# Patient Record
Sex: Female | Born: 1980 | Race: Black or African American | Hispanic: No | Marital: Single | Smoking: Current every day smoker
Health system: Southern US, Community
[De-identification: ages and names within clinical notes are randomized; demographics above are authoritative.]

## PROBLEM LIST (undated history)

## (undated) DIAGNOSIS — I10 Essential (primary) hypertension: Secondary | ICD-10-CM

## (undated) DIAGNOSIS — E079 Disorder of thyroid, unspecified: Secondary | ICD-10-CM

## (undated) HISTORY — PX: TUBAL LIGATION: SHX77

---

## 2010-01-24 ENCOUNTER — Emergency Department (HOSPITAL_BASED_OUTPATIENT_CLINIC_OR_DEPARTMENT_OTHER)
Admission: EM | Admit: 2010-01-24 | Discharge: 2010-01-24 | Payer: Self-pay | Source: Home / Self Care | Admitting: Emergency Medicine

## 2010-01-30 LAB — URINALYSIS, ROUTINE W REFLEX MICROSCOPIC
Bilirubin Urine: NEGATIVE
Hgb urine dipstick: NEGATIVE
Ketones, ur: NEGATIVE mg/dL
Nitrite: NEGATIVE
Protein, ur: NEGATIVE mg/dL
Specific Gravity, Urine: 1.021 (ref 1.005–1.030)
Urine Glucose, Fasting: NEGATIVE mg/dL
Urobilinogen, UA: 1 mg/dL (ref 0.0–1.0)
pH: 6.5 (ref 5.0–8.0)

## 2010-01-30 LAB — URINE MICROSCOPIC-ADD ON

## 2010-01-30 LAB — PREGNANCY, URINE: Preg Test, Ur: NEGATIVE

## 2010-03-10 ENCOUNTER — Emergency Department (INDEPENDENT_AMBULATORY_CARE_PROVIDER_SITE_OTHER): Payer: Medicaid Other

## 2010-03-10 ENCOUNTER — Emergency Department (HOSPITAL_BASED_OUTPATIENT_CLINIC_OR_DEPARTMENT_OTHER)
Admission: EM | Admit: 2010-03-10 | Discharge: 2010-03-10 | Disposition: A | Discharge status: Home / Self Care | Payer: Medicaid Other | Attending: Emergency Medicine | Admitting: Emergency Medicine

## 2010-03-10 DIAGNOSIS — N949 Unspecified condition associated with female genital organs and menstrual cycle: Secondary | ICD-10-CM | POA: Insufficient documentation

## 2010-03-10 DIAGNOSIS — N83209 Unspecified ovarian cyst, unspecified side: Secondary | ICD-10-CM | POA: Insufficient documentation

## 2010-03-10 DIAGNOSIS — N898 Other specified noninflammatory disorders of vagina: Secondary | ICD-10-CM | POA: Insufficient documentation

## 2010-03-10 DIAGNOSIS — R109 Unspecified abdominal pain: Secondary | ICD-10-CM | POA: Insufficient documentation

## 2010-03-10 DIAGNOSIS — I10 Essential (primary) hypertension: Secondary | ICD-10-CM | POA: Insufficient documentation

## 2010-03-10 DIAGNOSIS — N9489 Other specified conditions associated with female genital organs and menstrual cycle: Secondary | ICD-10-CM

## 2010-03-10 LAB — URINALYSIS, ROUTINE W REFLEX MICROSCOPIC
Bilirubin Urine: NEGATIVE
Hgb urine dipstick: NEGATIVE
Ketones, ur: NEGATIVE mg/dL
Nitrite: NEGATIVE
Protein, ur: NEGATIVE mg/dL
Specific Gravity, Urine: 1.021 (ref 1.005–1.030)
Urine Glucose, Fasting: NEGATIVE mg/dL
Urobilinogen, UA: 0.2 mg/dL (ref 0.0–1.0)
pH: 7.5 (ref 5.0–8.0)

## 2010-03-10 LAB — WET PREP, GENITAL
Clue Cells Wet Prep HPF POC: NONE SEEN
Trich, Wet Prep: NONE SEEN
Yeast Wet Prep HPF POC: NONE SEEN

## 2010-03-10 LAB — PREGNANCY, URINE: Preg Test, Ur: NEGATIVE

## 2010-03-13 LAB — GC/CHLAMYDIA PROBE AMP, GENITAL
Chlamydia, DNA Probe: NEGATIVE
GC Probe Amp, Genital: NEGATIVE

## 2011-03-21 ENCOUNTER — Emergency Department (HOSPITAL_BASED_OUTPATIENT_CLINIC_OR_DEPARTMENT_OTHER)
Admission: EM | Admit: 2011-03-21 | Discharge: 2011-03-21 | Disposition: A | Discharge status: Home Health | Payer: Medicaid Other | Attending: Emergency Medicine | Admitting: Emergency Medicine

## 2011-03-21 ENCOUNTER — Encounter (HOSPITAL_BASED_OUTPATIENT_CLINIC_OR_DEPARTMENT_OTHER): Payer: Self-pay | Admitting: Family Medicine

## 2011-03-21 DIAGNOSIS — I10 Essential (primary) hypertension: Secondary | ICD-10-CM | POA: Insufficient documentation

## 2011-03-21 DIAGNOSIS — G43909 Migraine, unspecified, not intractable, without status migrainosus: Secondary | ICD-10-CM | POA: Insufficient documentation

## 2011-03-21 DIAGNOSIS — R42 Dizziness and giddiness: Secondary | ICD-10-CM | POA: Insufficient documentation

## 2011-03-21 DIAGNOSIS — H538 Other visual disturbances: Secondary | ICD-10-CM | POA: Insufficient documentation

## 2011-03-21 HISTORY — DX: Essential (primary) hypertension: I10

## 2011-03-21 HISTORY — DX: Disorder of thyroid, unspecified: E07.9

## 2011-03-21 MED ORDER — DEXAMETHASONE SODIUM PHOSPHATE 10 MG/ML IJ SOLN
10.0000 mg | Freq: Once | INTRAMUSCULAR | Status: AC
  Administered 2011-03-21: 10 mg via INTRAVENOUS
  Filled 2011-03-21: qty 1

## 2011-03-21 MED ORDER — METOCLOPRAMIDE HCL 5 MG/ML IJ SOLN
10.0000 mg | Freq: Once | INTRAMUSCULAR | Status: AC
  Administered 2011-03-21: 10 mg via INTRAVENOUS
  Filled 2011-03-21: qty 2

## 2011-03-21 MED ORDER — KETOROLAC TROMETHAMINE 30 MG/ML IJ SOLN
30.0000 mg | Freq: Once | INTRAMUSCULAR | Status: AC
  Administered 2011-03-21: 30 mg via INTRAVENOUS
  Filled 2011-03-21: qty 1

## 2011-03-21 MED ORDER — MECLIZINE HCL 25 MG PO TABS
25.0000 mg | ORAL_TABLET | Freq: Once | ORAL | Status: AC
  Administered 2011-03-21: 25 mg via ORAL
  Filled 2011-03-21: qty 1

## 2011-03-21 NOTE — ED Provider Notes (Signed)
History     CSN: [id number]  Arrival date & time 03/21/11  1107   First MD Initiated Contact with Patient 03/21/11 1138      Chief Complaint  Patient presents with  . Headache  . Blurred Vision    (Consider location/radiation/quality/duration/timing/severity/associated sxs/prior treatment) HPI Comments: She also states that she has had dizziness today. It is worse with standing and moving her head. Date she feels like she is on miracle round.  Patient is a 31 y.o. female presenting with headaches. The history is provided by the patient.  Headache  This is a recurrent problem. The current episode started less than 1 hour ago. The problem occurs constantly. The problem has been gradually worsening. The headache is associated with loud noise and activity. The pain is located in the frontal region. The quality of the pain is described as throbbing. The pain is at a severity of 8/10. The pain is moderate. The pain does not radiate. Pertinent negatives include no nausea and no vomiting. She has tried nothing for the symptoms. The treatment provided no relief.    Past Medical History  Diagnosis Date  . Thyroid disease   . Hypertension   . Migraine     History reviewed. No pertinent past surgical history.  No family history on file.  History  Substance Use Topics  . Smoking status: Current Everyday Smoker    Types: Cigarettes  . Smokeless tobacco: Not on file  . Alcohol Use: No    OB History    Grav Para Term Preterm Abortions TAB SAB Ect Mult Living                  Review of Systems  Gastrointestinal: Negative for nausea and vomiting.  Neurological: Positive for headaches.  All other systems reviewed and are negative.    Allergies  Benadryl  Home Medications   Current Outpatient Rx  Name Route Sig Dispense Refill  . TYLENOL PO Oral Take by mouth.      BP 138/81  Pulse 80  Temp(Src) 97.6 F (36.4 C) (Oral)  Resp 20  Ht 4\' 10"  (1.473 m)  Wt 115 lb (52.164  kg)  BMI 24.04 kg/m2  SpO2 100%  LMP 03/01/2011  Physical Exam  Nursing note and vitals reviewed. Constitutional: She is oriented to person, place, and time. She appears well-developed and well-nourished. No distress.  HENT:  Head: Normocephalic and atraumatic.  Right Ear: External ear normal.  Left Ear: External ear normal.  Mouth/Throat: Oropharynx is clear and moist.  Eyes: EOM are normal. Pupils are equal, round, and reactive to light.       No nystagmus  Cardiovascular: Normal rate, regular rhythm, normal heart sounds and intact distal pulses.  Exam reveals no friction rub.   No murmur heard. Pulmonary/Chest: Effort normal and breath sounds normal. She has no wheezes. She has no rales.  Abdominal: Soft. Bowel sounds are normal. She exhibits no distension. There is no tenderness. There is no rebound and no guarding.  Musculoskeletal: Normal range of motion. She exhibits no tenderness.       No edema  Neurological: She is alert and oriented to person, place, and time. She has normal strength. No cranial nerve deficit or sensory deficit. Coordination normal.       No photophobia  Skin: Skin is warm and dry. No rash noted.  Psychiatric: She has a normal mood and affect. Her behavior is normal.    ED Course  Procedures (including critical care  time)  Labs Reviewed - No data to display No results found.   No diagnosis found.    MDM   Pt with typical migraine HA without sx suggestive of SAH(sudden onset, worst of life, or deficits), infection, or cavernous vein thrombosis.  Normal neuro exam and vital signs.  Also has symptoms suggestive of vertigo. States her spinning is worse when she stands. She has a history of migraines and states usually she will get pain behind her eyes and then the headache starts which is what happened today. She denies any infections or recent sinus complaints. Will give HA cocktail and  re-eval.  1:08 PM Patient feeling much better and her  headache is resolved.        Gwyneth Sprout, MD 03/21/11 1308

## 2011-03-21 NOTE — Discharge Instructions (Signed)
Migraine Headache  A migraine is very bad pain on one or both sides of your head. The cause of a migraine is not always known. A migraine can be triggered or caused by different things, such as:   Alcohol.   Smoking.   Stress.   Periods (menstruation) in women.   Aged cheeses.   Foods or drinks that contain nitrates, glutamate, aspartame, or tyramine.   Lack of sleep.   Chocolate.   Caffeine.   Hunger.   Medicines, such as nitroglycerine (used to treat chest pain), birth control pills, estrogen, and some blood pressure medicines.  HOME CARE   Many medicines can help migraine pain or keep migraines from coming back. Your doctor can help you decide on a medicine or treatment program.   If you or your child gets a migraine, it may help to lie down in a dark, quiet room.   Keep a headache journal. This may help find out what is causing the headaches. For example, write down:   What you eat and drink.   How much sleep you get.   Any change to your diet or medicines.  GET HELP RIGHT AWAY IF:    The medicine does not work.   The pain begins again.   The neck is stiff.   You have trouble seeing.   The muscles are weak or you lose muscle control.   You have new symptoms.   You lose your balance.   You have trouble walking.   You feel faint or pass out.  MAKE SURE YOU:    Understand these instructions.   Will watch this condition.   Will get help right away if you are not doing well or get worse.  Document Released: 10/11/2007 Document Revised: 12/21/2010 Document Reviewed: 09/06/2008  [employer] Patient Information 2012 [employer].

## 2011-03-21 NOTE — ED Notes (Signed)
Pt c/o "pain around my eyes and blurry vision" x 1 hour. Pt c/o left side of head "numb" and left hand "numb". Pt reports h/o "migraines with medication until 2 years ago".

## 2011-05-27 ENCOUNTER — Emergency Department (HOSPITAL_BASED_OUTPATIENT_CLINIC_OR_DEPARTMENT_OTHER)
Admission: EM | Admit: 2011-05-27 | Discharge: 2011-05-27 | Disposition: A | Discharge status: Home / Self Care | Payer: Medicaid Other | Attending: Emergency Medicine | Admitting: Emergency Medicine

## 2011-05-27 ENCOUNTER — Encounter (HOSPITAL_BASED_OUTPATIENT_CLINIC_OR_DEPARTMENT_OTHER): Payer: Self-pay | Admitting: *Deleted

## 2011-05-27 DIAGNOSIS — I10 Essential (primary) hypertension: Secondary | ICD-10-CM | POA: Insufficient documentation

## 2011-05-27 DIAGNOSIS — F17200 Nicotine dependence, unspecified, uncomplicated: Secondary | ICD-10-CM | POA: Insufficient documentation

## 2011-05-27 DIAGNOSIS — N946 Dysmenorrhea, unspecified: Secondary | ICD-10-CM | POA: Insufficient documentation

## 2011-05-27 DIAGNOSIS — E079 Disorder of thyroid, unspecified: Secondary | ICD-10-CM | POA: Insufficient documentation

## 2011-05-27 DIAGNOSIS — N83209 Unspecified ovarian cyst, unspecified side: Secondary | ICD-10-CM | POA: Insufficient documentation

## 2011-05-27 LAB — WET PREP, GENITAL
Clue Cells Wet Prep HPF POC: NONE SEEN
Trich, Wet Prep: NONE SEEN
Yeast Wet Prep HPF POC: NONE SEEN

## 2011-05-27 LAB — URINALYSIS, ROUTINE W REFLEX MICROSCOPIC
Bilirubin Urine: NEGATIVE
Glucose, UA: NEGATIVE mg/dL
Ketones, ur: NEGATIVE mg/dL
Leukocytes, UA: NEGATIVE
Nitrite: NEGATIVE
Protein, ur: 30 mg/dL — AB
Specific Gravity, Urine: 1.025 (ref 1.005–1.030)
Urobilinogen, UA: 1 mg/dL (ref 0.0–1.0)
pH: 6 (ref 5.0–8.0)

## 2011-05-27 LAB — URINE MICROSCOPIC-ADD ON

## 2011-05-27 LAB — PREGNANCY, URINE: Preg Test, Ur: NEGATIVE

## 2011-05-27 MED ORDER — OXYCODONE-ACETAMINOPHEN 5-325 MG PO TABS
1.0000 | ORAL_TABLET | Freq: Once | ORAL | Status: AC
  Administered 2011-05-27: 1 via ORAL
  Filled 2011-05-27: qty 1

## 2011-05-27 MED ORDER — OXYCODONE-ACETAMINOPHEN 5-325 MG PO TABS: 1.0000 | ORAL_TABLET | Freq: Four times a day (QID) | ORAL | Status: AC | PRN

## 2011-05-27 NOTE — ED Notes (Signed)
Pt states she has a hx of irregular periods and this one started day before yesterday. Started out "light", which was unusual per pt, but now having heavy bleeding and clots, LLQ pain. Used 5 regular pads today.

## 2011-05-27 NOTE — Discharge Instructions (Signed)
Dysmenorrhea Menstrual pain is caused by the muscles of the uterus tightening (contracting) during a menstrual period. The muscles of the uterus contract due to the chemicals in the uterine lining. Primary dysmenorrhea is menstrual cramps that last a couple of days when you start having menstrual periods or soon after. This often begins after a teenager starts having her period. As a woman gets older or has a baby, the cramps will usually lesson or disappear. Secondary dysmenorrhea begins later in life, lasts longer, and the pain may be stronger than primary dysmenorrhea. The pain may start before the period and last a few days after the period. This type of dysmenorrhea is usually caused by an underlying problem such as:  The tissue lining the uterus grows outside of the uterus in other areas of the body (endometriosis).   The endometrial tissue, which normally lines the uterus, is found in or grows into the muscular walls of the uterus (adenomyosis).   The pelvic blood vessels are engorged with blood just before the menstrual period (pelvic congestive syndrome).   Overgrowth of cells in the lining of the uterus or cervix (polyps of the uterus or cervix).   Falling down of the uterus (prolapse) because of loose or stretched ligaments.   Depression.   Bladder problems, infection, or inflammation.   Problems with the intestine, a tumor, or irritable bowel syndrome.   Cancer of the female organs or bladder.   A severely tipped uterus.   A very tight opening or closed cervix.   Noncancerous tumors of the uterus (fibroids).   Pelvic inflammatory disease (PID).   Pelvic scarring (adhesions) from a previous surgery.   Ovarian cyst.   An intrauterine device (IUD) used for birth control.  CAUSES  The cause of menstrual pain is often unknown. SYMPTOMS   Cramping or throbbing pain in your lower abdomen.   Sometimes, a woman may also experience headaches.   Lower back pain.    Feeling sick to your stomach (nausea) or vomiting.   Diarrhea.   Sweating or dizziness.  DIAGNOSIS  A diagnosis is based on your history, symptoms, physical examination, diagnostic tests, or procedures. Diagnostic tests or procedures may include:  Blood tests.   An ultrasound.   An examination of the lining of the uterus (dilation and curettage, D&C).   An examination inside your abdomen or pelvis with a scope (laparoscopy).   X-rays.   CT Scan.   MRI.   An examination inside the bladder with a scope (cystoscopy).   An examination inside the intestine or stomach with a scope (colonoscopy, gastroscopy).  TREATMENT  Treatment depends on the cause of the dysmenorrhea. Treatment may include:  Pain medicine prescribed by your caregiver.   Birth control pills.   Hormone replacement therapy.   Nonsteroidal anti-inflammatory drugs (NSAIDs). These may help stop the production of prostaglandins.   An IUD with progesterone hormone in it.   Acupuncture.   Surgery to remove adhesions, endometriosis, ovarian cyst, or fibroids.   Removal of the uterus (hysterectomy).   Progesterone shots to stop the menstrual period.   Cutting the nerves on the sacrum that go to the female organs (presacral neurectomy).   Electric currant to the sacral nerves (sacral nerve stimulation).   Antidepressant medicine.   Psychiatric therapy, counseling, or group therapy.   Exercise and physical therapy.   Meditation and yoga therapy.  HOME CARE INSTRUCTIONS   Only take over-the-counter or prescription medicines for pain, discomfort, or fever as directed by your   caregiver.   Place a heating pad or hot water bottle on your lower back or abdomen. Do not sleep with the heating pad.   Use aerobic exercises, walking, swimming, biking, and other exercises to help lessen the cramping.   Massage to the lower back or abdomen may help.   Stop smoking.   Avoid alcohol and caffeine.   Yoga,  meditation, or acupuncture may help.  SEEK MEDICAL CARE IF:   The pain does not get better with medicine.   You have pain with sexual intercourse.  SEEK IMMEDIATE MEDICAL CARE IF:   Your pain increases and is not controlled with medicines.   You have a fever.   You develop nausea or vomiting with your period not controlled with medicine.   You have abnormal vaginal bleeding with your period.   You pass out.  MAKE SURE YOU:   Understand these instructions.   Will watch your condition.   Will get help right away if you are not doing well or get worse.  Document Released: 01/01/2005 Document Revised: 12/21/2010 Document Reviewed: 04/19/2008 [facility] Patient Information 2012 [place], [place].Ovarian Cyst An ovarian cyst is a sac filled with fluid or blood. This sac is attached to the ovary. Some cysts go away on their own. Other cysts need treatment.  HOME CARE   Only take medicine as told by your doctor.   Follow up with your doctor as told.  GET HELP RIGHT AWAY IF:   You develop sudden pain.   Your belly (abdomen) becomes large or puffy (swollen).   You have a hard time peeing (totally emptying your bladder).   You feel sick most of the time.   You have a temperature by mouth above 102 F (38.9 C), not controlled by medicine.   Your periods are late, not regular, or painful.   Your belly or pelvic pain does not go away.   You have pressure on your bladder.   You have pain during sex.   You feel fullness, pressure, or discomfort in your belly.   You lose weight for no reason.  MAKE SURE YOU:   Understand these instructions.   Will watch your condition.   Will get help right away if you are not doing well or get worse.  Document Released: 06/20/2007 Document Revised: 12/21/2010 Document Reviewed: 12/03/2008 [facility] Patient Information 2012 [place], [place].

## 2011-05-27 NOTE — ED Provider Notes (Addendum)
History   This chart was scribed for Gwyneth Sprout, MD by Shari Heritage. The patient was seen in room MH05/MH05. Patient's care was started at 0005.  CSN: [id number]  Arrival date & time 05/27/11  0005   First MD Initiated Contact with Patient 05/27/11 0015      Chief Complaint  Patient presents with  . Abdominal Pain    The history is provided by the patient. No language interpreter was used.   Jaime Dunlap is a 31 y.o. female who presents to the Emergency Department complaining of constant, moderate to severe abdominal pain onset earlier today associated with increased menorrhea. Patient says that the pain is sharp and feels like someone is hitting her. Patient has taken no medications for relief. Patient has been pregnant in the past but not currently pregnant. Patient denies any vaginal itching or irregular discharge. Patient reports no vomiting, nausea, chest pain, HA or visual disturbance. Patient has h/o of bilateral ovarian cysts, thyroid disease, HTN and migraines. Patient is a current every cigarette smoker.  Past Medical History  Diagnosis Date  . Thyroid disease   . Hypertension   . Migraine     No past surgical history on file.  No family history on file.  History  Substance Use Topics  . Smoking status: Current Everyday Smoker    Types: Cigarettes  . Smokeless tobacco: Not on file  . Alcohol Use: No    OB History    Grav Para Term Preterm Abortions TAB SAB Ect Mult Living                  Review of Systems A complete 10 system review of systems was obtained and all systems are negative except as noted in the HPI and PMH.   Allergies  Benadryl  Home Medications   Current Outpatient Rx  Name Route Sig Dispense Refill  . TYLENOL PO Oral Take by mouth.      There were no vitals taken for this visit.  Physical Exam  Nursing note and vitals reviewed. Constitutional: She is oriented to person, place, and time. She appears well-developed and  well-nourished.  HENT:  Head: Normocephalic and atraumatic.  Eyes: Conjunctivae and EOM are normal. Pupils are equal, round, and reactive to light.  Neck: Normal range of motion. Neck supple.  Cardiovascular: Normal rate and regular rhythm.   Pulmonary/Chest: Effort normal and breath sounds normal.  Abdominal: Soft. Bowel sounds are normal. There is tenderness (Left pelvic, no CVA).  Genitourinary: Uterus normal. Cervix exhibits no motion tenderness. Right adnexum displays tenderness. Right adnexum displays no mass and no fullness. Left adnexum displays tenderness. Left adnexum displays no mass and no fullness. There is bleeding around the vagina. No vaginal discharge found.       Adnexal tenderness is more pronounced on the left  Musculoskeletal: Normal range of motion.  Neurological: She is alert and oriented to person, place, and time.  Skin: Skin is warm and dry.  Psychiatric: She has a normal mood and affect.    ED Course  Procedures (including critical care time) 12:19AM- Patient informed of current plan for treatment and evaluation and agrees with plan at this time. Will prescribe pain medication.  Labs Reviewed  URINALYSIS, ROUTINE W REFLEX MICROSCOPIC - Abnormal; Notable for the following:    Color, Urine RED (*) BIOCHEMICALS MAY BE AFFECTED BY COLOR   APPearance TURBID (*)    Hgb urine dipstick LARGE (*)    Protein, ur 30 (*)  All other components within normal limits  URINE MICROSCOPIC-ADD ON - Abnormal; Notable for the following:    Bacteria, UA FEW (*)    All other components within normal limits  PREGNANCY, URINE  WET PREP, GENITAL  GC/CHLAMYDIA PROBE AMP, GENITAL   No results found.   1. Ovarian cyst   2. Dysmenorrhea       MDM   Patient with pelvic pain worse on the left that started today. Her menses started 2 days ago and was initially very light but she's states yesterday her flow became very heavy with large clots. She denies any dysuria or vaginal  discharge or burning. On pelvic exam patient has adnexal tenderness on the right and the left but it's more pronounced on the left. Patient has no symptoms concerning for tubo-ovarian abscess or ovarian torsion at this time. It is possible that she has an ovarian cyst causing her pain. UPT is negative GC and Chlamydia were sent. Wet prep wnl. Discussed findings with patient and she has an OB/GYN who she will followup with on Monday to have ultrasound done in the office if pain continues. I personally performed the services described in this documentation, which was scribed in my presence.  The recorded information has been reviewed and considered.         Gwyneth Sprout, MD 05/27/11 4540  Gwyneth Sprout, MD 05/27/11 9811  Gwyneth Sprout, MD 05/27/11 9147

## 2011-05-28 LAB — GC/CHLAMYDIA PROBE AMP, GENITAL
Chlamydia, DNA Probe: NEGATIVE
GC Probe Amp, Genital: NEGATIVE

## 2011-12-15 ENCOUNTER — Emergency Department (HOSPITAL_BASED_OUTPATIENT_CLINIC_OR_DEPARTMENT_OTHER)
Admission: EM | Admit: 2011-12-15 | Discharge: 2011-12-15 | Disposition: A | Discharge status: Home / Self Care | Payer: Medicaid Other | Attending: Emergency Medicine | Admitting: Emergency Medicine

## 2011-12-15 ENCOUNTER — Encounter (HOSPITAL_BASED_OUTPATIENT_CLINIC_OR_DEPARTMENT_OTHER): Payer: Self-pay | Admitting: *Deleted

## 2011-12-15 DIAGNOSIS — E079 Disorder of thyroid, unspecified: Secondary | ICD-10-CM | POA: Insufficient documentation

## 2011-12-15 DIAGNOSIS — I10 Essential (primary) hypertension: Secondary | ICD-10-CM | POA: Insufficient documentation

## 2011-12-15 DIAGNOSIS — Z8669 Personal history of other diseases of the nervous system and sense organs: Secondary | ICD-10-CM | POA: Insufficient documentation

## 2011-12-15 DIAGNOSIS — B86 Scabies: Secondary | ICD-10-CM | POA: Insufficient documentation

## 2011-12-15 DIAGNOSIS — F17200 Nicotine dependence, unspecified, uncomplicated: Secondary | ICD-10-CM | POA: Insufficient documentation

## 2011-12-15 DIAGNOSIS — Z79899 Other long term (current) drug therapy: Secondary | ICD-10-CM | POA: Insufficient documentation

## 2011-12-15 DIAGNOSIS — L299 Pruritus, unspecified: Secondary | ICD-10-CM | POA: Insufficient documentation

## 2011-12-15 MED ORDER — PERMETHRIN 5 % EX CREA: TOPICAL_CREAM | CUTANEOUS | Status: DC

## 2011-12-15 MED ORDER — DEXAMETHASONE SODIUM PHOSPHATE 10 MG/ML IJ SOLN: 10.0000 mg | Freq: Once | INTRAMUSCULAR | Status: DC

## 2011-12-15 NOTE — ED Notes (Signed)
MD at bedside. 

## 2011-12-15 NOTE — ED Provider Notes (Signed)
History     CSN: [id number]  Arrival date & time 12/15/11  1513   First MD Initiated Contact with Patient 12/15/11 1547      Chief Complaint  Patient presents with  . Rash    (Consider location/radiation/quality/duration/timing/severity/associated sxs/prior treatment) HPI Comments: This is a 31 year old female who presents emergency department with chief complaint of rash and itching x3 weeks. The patient states that she has not tried anything to alleviate her symptoms. She has not experienced these symptoms before. She states that her discomfort from the itching is moderate to severe. She states that nothing makes her symptoms better or worse.  The history is provided by the patient. No language interpreter was used.    Past Medical History  Diagnosis Date  . Thyroid disease   . Hypertension   . Migraine     History reviewed. No pertinent past surgical history.  History reviewed. No pertinent family history.  History  Substance Use Topics  . Smoking status: Current Every Day Smoker    Types: Cigarettes  . Smokeless tobacco: Not on file  . Alcohol Use: No    OB History    Grav Para Term Preterm Abortions TAB SAB Ect Mult Living                  Review of Systems  All other systems reviewed and are negative.    Allergies  Benadryl  Home Medications   Current Outpatient Rx  Name  Route  Sig  Dispense  Refill  . TYLENOL PO   Oral   Take by mouth.           BP 141/86  Pulse 78  Temp 98.3 F (36.8 C) (Oral)  Resp 18  Ht 4\' 10"  (1.473 m)  Wt 125 lb (56.7 kg)  BMI 26.13 kg/m2  SpO2 99%  LMP 11/20/2011  Physical Exam  Nursing note and vitals reviewed. Constitutional: She is oriented to person, place, and time. She appears well-developed and well-nourished.  HENT:  Head: Normocephalic and atraumatic.  Eyes: Conjunctivae normal and EOM are normal. Pupils are equal, round, and reactive to light.  Neck: Normal range of motion. Neck supple.    Cardiovascular: Normal rate and regular rhythm.  Exam reveals no gallop and no friction rub.   No murmur heard. Pulmonary/Chest: Effort normal and breath sounds normal. No respiratory distress. She has no wheezes. She has no rales. She exhibits no tenderness.  Abdominal: Soft. Bowel sounds are normal. She exhibits no distension and no mass. There is no tenderness. There is no rebound and no guarding.  Musculoskeletal: Normal range of motion. She exhibits no edema and no tenderness.  Neurological: She is alert and oriented to person, place, and time.  Skin: Skin is warm and dry. Rash noted.       Diffuse papules characteristic of scabies  Psychiatric: She has a normal mood and affect. Her behavior is normal. Judgment and thought content normal.    ED Course  Procedures (including critical care time)  Labs Reviewed - No data to display No results found.   1. Scabies       MDM  31 year old female with suspected scabies. I'm going to treat with Decadron and permethrin. Patient told to return if her symptoms do not improve after treatment. Patient understands and agrees with plan. She is stable and ready for discharge.       Roxy Horseman, PA-C 12/15/11 1757

## 2011-12-15 NOTE — ED Provider Notes (Signed)
Medical screening examination/treatment/procedure(s) were performed by non-physician practitioner and as supervising physician I was immediately available for consultation/collaboration.   Albertina Leise Y. Journee Bobrowski, MD 12/15/11 2258 

## 2011-12-15 NOTE — ED Notes (Signed)
Pt and significant other have rash to body. +itching. Has been going on x 3 weeks.

## 2012-01-30 ENCOUNTER — Encounter (HOSPITAL_BASED_OUTPATIENT_CLINIC_OR_DEPARTMENT_OTHER): Payer: Self-pay | Admitting: *Deleted

## 2012-01-30 ENCOUNTER — Emergency Department (HOSPITAL_BASED_OUTPATIENT_CLINIC_OR_DEPARTMENT_OTHER)
Admission: EM | Admit: 2012-01-30 | Discharge: 2012-01-30 | Disposition: A | Discharge status: Home / Self Care | Payer: Medicaid Other | Attending: Emergency Medicine | Admitting: Emergency Medicine

## 2012-01-30 DIAGNOSIS — F17200 Nicotine dependence, unspecified, uncomplicated: Secondary | ICD-10-CM | POA: Insufficient documentation

## 2012-01-30 DIAGNOSIS — Z862 Personal history of diseases of the blood and blood-forming organs and certain disorders involving the immune mechanism: Secondary | ICD-10-CM | POA: Insufficient documentation

## 2012-01-30 DIAGNOSIS — G43909 Migraine, unspecified, not intractable, without status migrainosus: Secondary | ICD-10-CM

## 2012-01-30 DIAGNOSIS — I10 Essential (primary) hypertension: Secondary | ICD-10-CM | POA: Insufficient documentation

## 2012-01-30 DIAGNOSIS — Z79899 Other long term (current) drug therapy: Secondary | ICD-10-CM | POA: Insufficient documentation

## 2012-01-30 DIAGNOSIS — Z8639 Personal history of other endocrine, nutritional and metabolic disease: Secondary | ICD-10-CM | POA: Insufficient documentation

## 2012-01-30 MED ORDER — PROMETHAZINE HCL 25 MG PO TABS: 25.0000 mg | ORAL_TABLET | Freq: Four times a day (QID) | ORAL | Status: DC | PRN

## 2012-01-30 MED ORDER — TRAMADOL HCL 50 MG PO TABS: 50.0000 mg | ORAL_TABLET | Freq: Four times a day (QID) | ORAL | Status: DC | PRN

## 2012-01-30 NOTE — ED Notes (Signed)
Pt c/o migraine x4 days

## 2012-01-30 NOTE — ED Provider Notes (Signed)
History     CSN: [id number]  Arrival date & time 01/30/12  1453   First MD Initiated Contact with Patient 01/30/12 1501      Chief Complaint  Patient presents with  . Migraine     HPI Patient with 3 four-day history of migraine headache.  Located on the left side of her head associated with nausea but no vomiting.  She had a feeling of warmth or coldness to the back of her head today which scared her that's why she came in.  She now back to normal except for slight L. headache.  She's had long history of migraines in the past.  She denies any speech or motor deficits. Past Medical History  Diagnosis Date  . Thyroid disease   . Hypertension   . Migraine     History reviewed. No pertinent past surgical history.  History reviewed. No pertinent family history.  History  Substance Use Topics  . Smoking status: Current Every Day Smoker -- 0.5 packs/day    Types: Cigarettes  . Smokeless tobacco: Not on file  . Alcohol Use: No    OB History    Grav Para Term Preterm Abortions TAB SAB Ect Mult Living                  Review of Systems All other systems reviewed and are negative Allergies  Benadryl  Home Medications   Current Outpatient Rx  Name  Route  Sig  Dispense  Refill  . TYLENOL PO   Oral   Take by mouth.         Marland Kitchen PERMETHRIN 5 % EX CREA      Apply to affected area once   60 g   0   . PROMETHAZINE HCL 25 MG PO TABS   Oral   Take 1 tablet (25 mg total) by mouth every 6 (six) hours as needed for nausea.   30 tablet   0   . TRAMADOL HCL 50 MG PO TABS   Oral   Take 1 tablet (50 mg total) by mouth every 6 (six) hours as needed for pain.   15 tablet   0     BP 148/96  Pulse 94  Temp 98.2 F (36.8 C) (Oral)  Resp 16  Ht 4\' 10"  (1.473 m)  Wt 125 lb (56.7 kg)  BMI 26.13 kg/m2  SpO2 100%  LMP 01/28/2012  Physical Exam  Nursing note and vitals reviewed. Constitutional: She is oriented to person, place, and time. She appears well-developed and  well-nourished. No distress.  HENT:  Head: Normocephalic and atraumatic.  Eyes: Pupils are equal, round, and reactive to light.  Neck: Normal range of motion.  Cardiovascular: Normal rate and intact distal pulses.   Pulmonary/Chest: No respiratory distress.  Abdominal: Normal appearance. She exhibits no distension.  Musculoskeletal: Normal range of motion.  Neurological: She is alert and oriented to person, place, and time. No cranial nerve deficit or sensory deficit. She displays a negative Romberg sign. GCS eye subscore is 4. GCS verbal subscore is 5. GCS motor subscore is 6. She displays no Babinski's sign on the right side. She displays no Babinski's sign on the left side.  Reflex Scores:      Patellar reflexes are 2+ on the right side and 2+ on the left side.      Achilles reflexes are 2+ on the right side and 2+ on the left side. Skin: Skin is warm and dry. No rash noted.  Psychiatric: She has a normal mood and affect. Her behavior is normal.    ED Course  Procedures (including critical care time)  Labs Reviewed - No data to display No results found.   1. Migraine headache       MDM  Patient offered migraine cocktail but did not want to take that.       Nelia Shi, MD 01/30/12 [phone]

## 2012-03-20 IMAGING — US US TRANSVAGINAL NON-OB
1 series · 14 of 25 positions shown · non-contrast
Comparison: None

CLINICAL DATA: Pelvic pain.

TRANSABDOMINAL AND TRANSVAGINAL ULTRASOUND OF PELVIS
TECHNIQUE: Both transabdominal and transvaginal ultrasound
examinations of the pelvis were performed including evaluation of
the uterus, ovaries, adnexal regions, and pelvic cul-de-sac.

[Series 1: us transvaginal non-ob · 0.22mm/px · 14 of 61 slices shown]
[im 1/61]
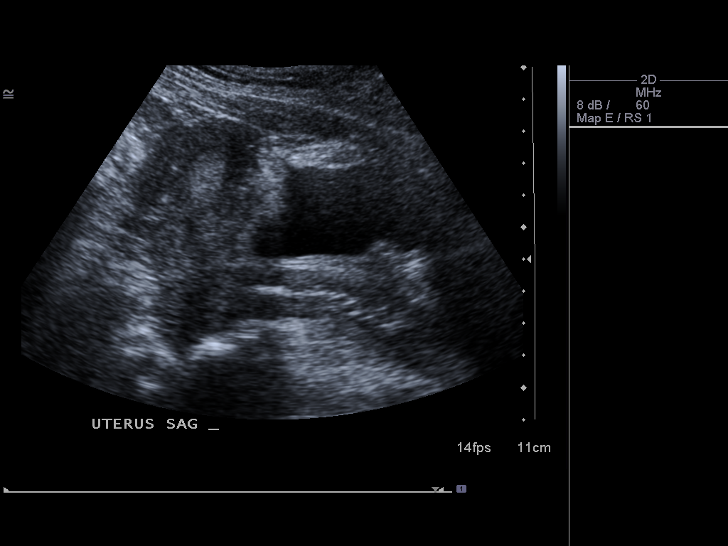
[im 6/61]
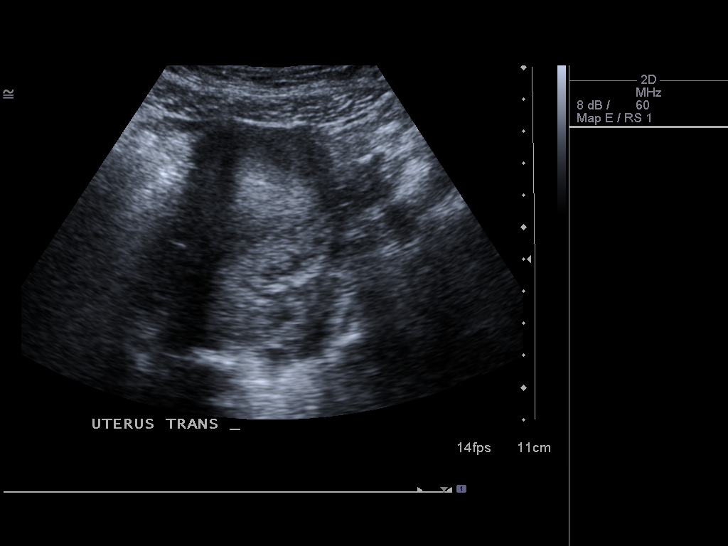
[im 11/61]
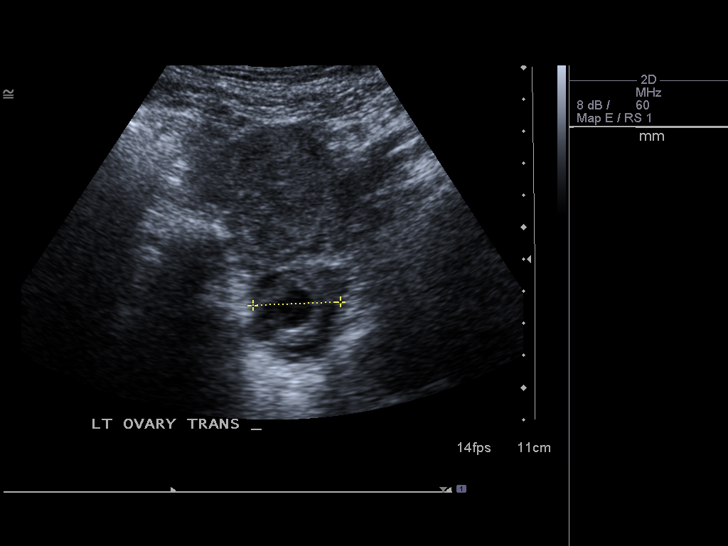
[im 16/61]
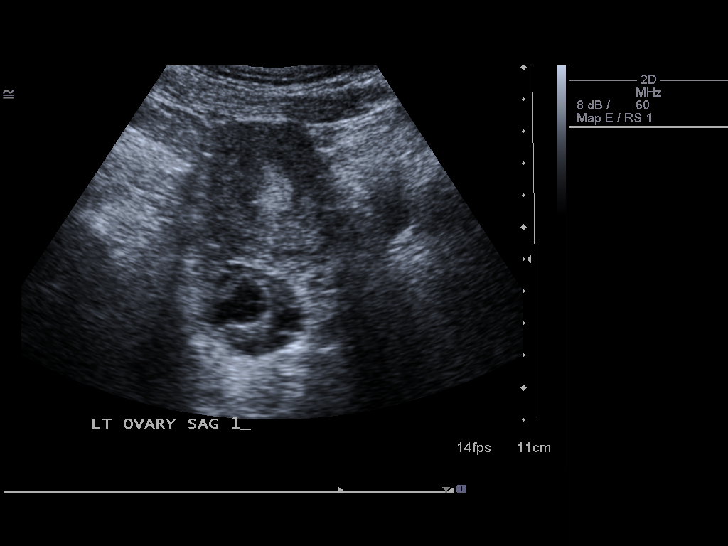
[im 21/61]
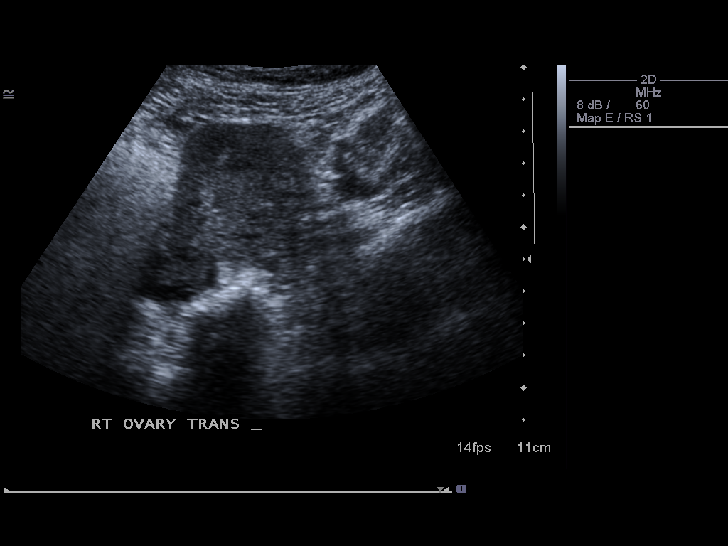
[im 23/61]
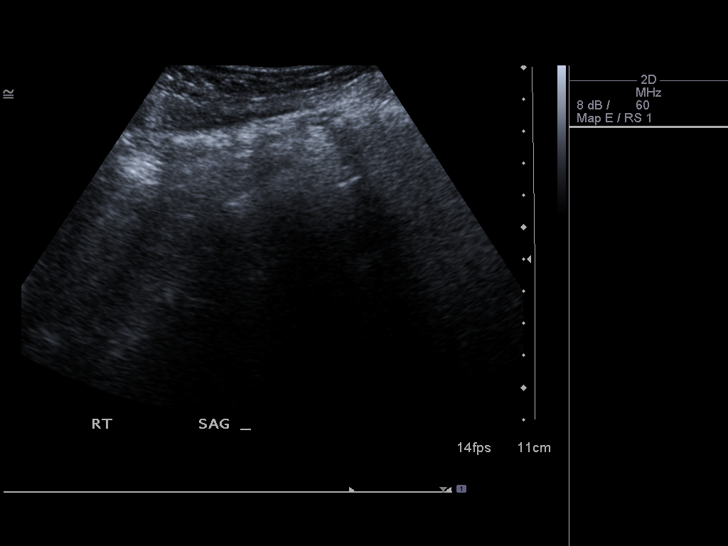
[im 28/61]
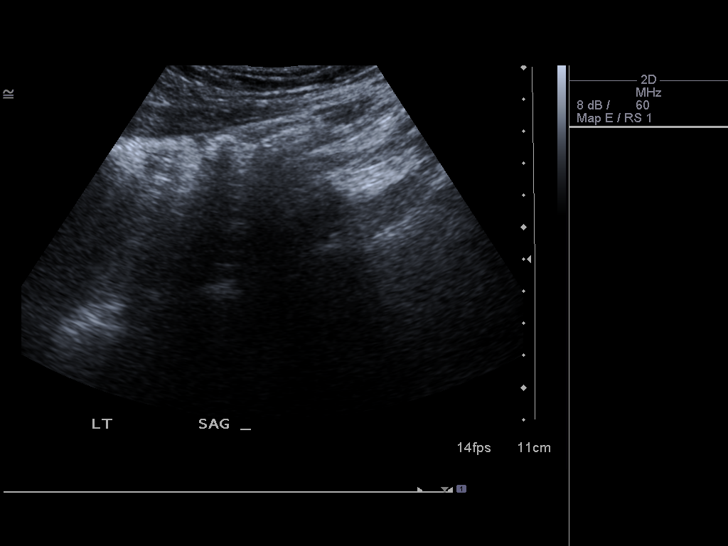
[im 33/61]
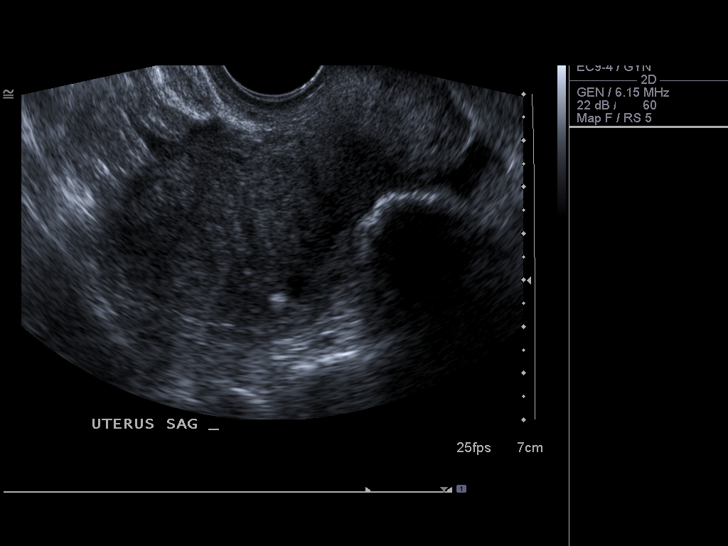
[im 38/61]
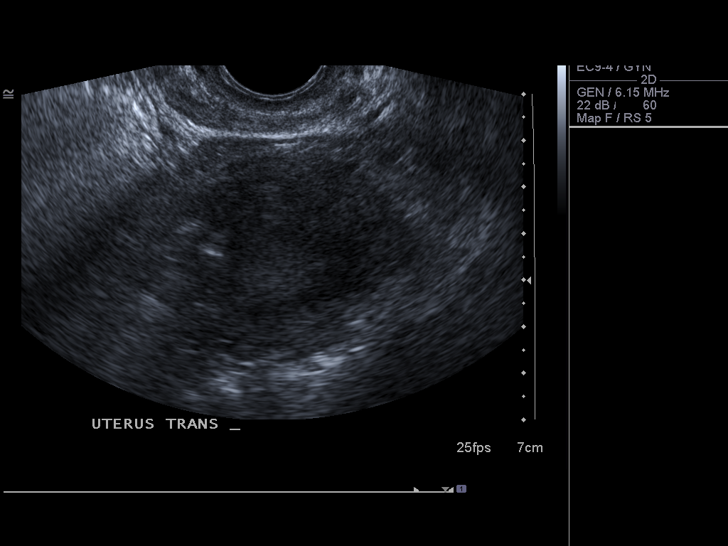
[im 41/61]
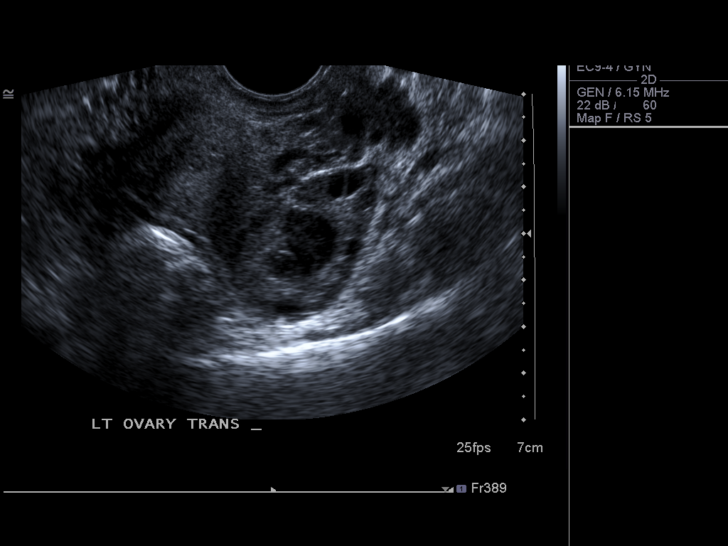
[im 46/61]
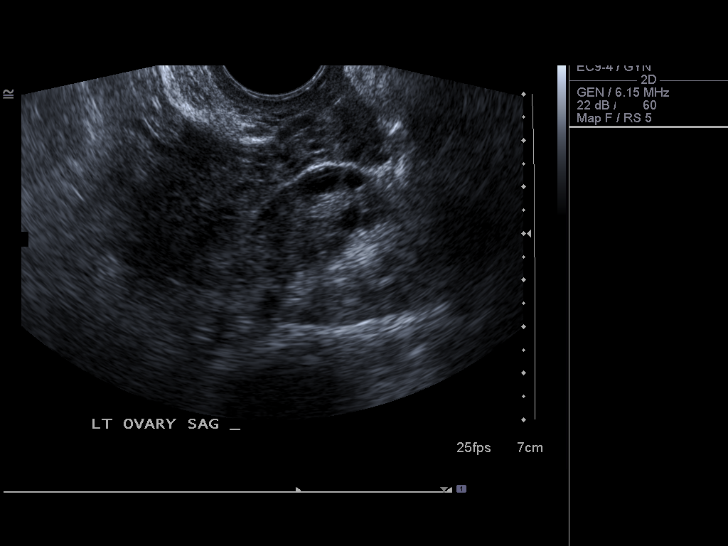
[im 51/61]
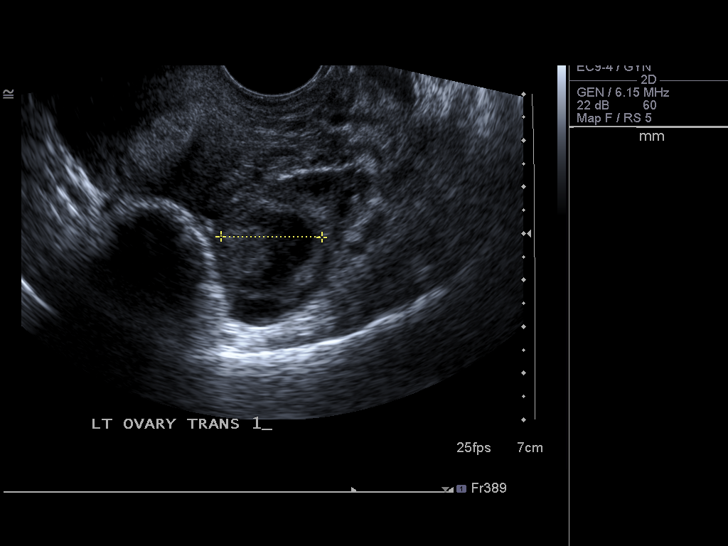
[im 56/61]
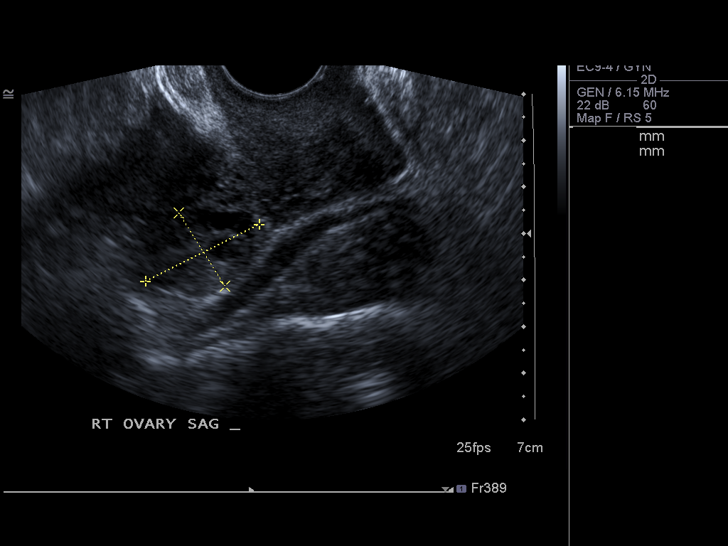
[im 61/61]
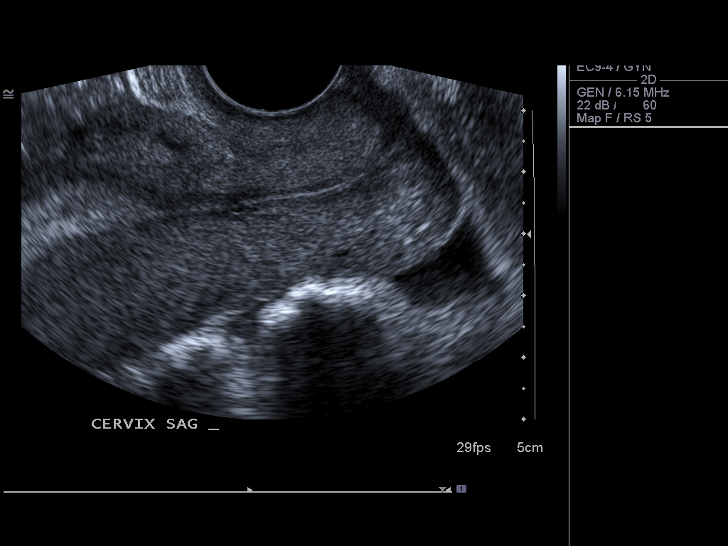

[14 of 25 positions shown; findings below may reference images not displayed]

FINDINGS: Uterus: 8.9 x 4.4 x 5.4 cm.  Normal echotexture.  No focal
abnormality.

Endometrium: Normal appearance and thickness, 11 mm.

Right Ovary: 2.7 x 1.9 x 2.5 cm. Normal size and echotexture.  No
adnexal masses.

Left Ovary: 3.7 x 2.7 x 2.8 cm.  2.5 cm complex cyst noted, likely
hemorrhagic cyst.

Other Findings:  Small amount of free fluid.
IMPRESSION: Small hemorrhagic cyst within the left ovary.

## 2013-06-09 ENCOUNTER — Encounter (HOSPITAL_BASED_OUTPATIENT_CLINIC_OR_DEPARTMENT_OTHER): Payer: Self-pay | Admitting: Emergency Medicine

## 2013-06-09 ENCOUNTER — Emergency Department (HOSPITAL_BASED_OUTPATIENT_CLINIC_OR_DEPARTMENT_OTHER)
Admission: EM | Admit: 2013-06-09 | Discharge: 2013-06-09 | Disposition: A | Discharge status: Home / Self Care | Payer: Medicaid Other | Attending: Emergency Medicine | Admitting: Emergency Medicine

## 2013-06-09 DIAGNOSIS — Z8639 Personal history of other endocrine, nutritional and metabolic disease: Secondary | ICD-10-CM | POA: Insufficient documentation

## 2013-06-09 DIAGNOSIS — Z79899 Other long term (current) drug therapy: Secondary | ICD-10-CM | POA: Insufficient documentation

## 2013-06-09 DIAGNOSIS — Z862 Personal history of diseases of the blood and blood-forming organs and certain disorders involving the immune mechanism: Secondary | ICD-10-CM | POA: Insufficient documentation

## 2013-06-09 DIAGNOSIS — I10 Essential (primary) hypertension: Secondary | ICD-10-CM | POA: Insufficient documentation

## 2013-06-09 DIAGNOSIS — F17200 Nicotine dependence, unspecified, uncomplicated: Secondary | ICD-10-CM | POA: Insufficient documentation

## 2013-06-09 DIAGNOSIS — Z8669 Personal history of other diseases of the nervous system and sense organs: Secondary | ICD-10-CM | POA: Insufficient documentation

## 2013-06-09 DIAGNOSIS — Z3202 Encounter for pregnancy test, result negative: Secondary | ICD-10-CM | POA: Insufficient documentation

## 2013-06-09 DIAGNOSIS — N390 Urinary tract infection, site not specified: Secondary | ICD-10-CM

## 2013-06-09 LAB — URINALYSIS, ROUTINE W REFLEX MICROSCOPIC
Bilirubin Urine: NEGATIVE
Glucose, UA: NEGATIVE mg/dL
Ketones, ur: NEGATIVE mg/dL
Nitrite: NEGATIVE
Protein, ur: NEGATIVE mg/dL
Specific Gravity, Urine: 1.014 (ref 1.005–1.030)
Urobilinogen, UA: 0.2 mg/dL (ref 0.0–1.0)
pH: 6 (ref 5.0–8.0)

## 2013-06-09 LAB — PREGNANCY, URINE: Preg Test, Ur: NEGATIVE

## 2013-06-09 LAB — URINE MICROSCOPIC-ADD ON

## 2013-06-09 MED ORDER — NITROFURANTOIN MONOHYD MACRO 100 MG PO CAPS: 100.0000 mg | ORAL_CAPSULE | Freq: Two times a day (BID) | ORAL | Status: DC

## 2013-06-09 NOTE — ED Notes (Signed)
Pt amb to triage with quick steady gait smiling in nad. Pt reports frequency, urgency and dysuria x this am.

## 2013-06-09 NOTE — ED Provider Notes (Signed)
CSN: [id number]     Arrival date & time 06/09/13  1817 History  This chart was scribed for Gwyneth SproutWhitney Mysha Peeler, MD by Bronson CurbJacqueline Melvin, ED Scribe. This patient was seen in room MH10/MH10 and the patient's care was started at 7:17 PM.    Chief Complaint  Patient presents with  . Urinary Urgency  . Dysuria  . Urinary Frequency      Patient is a 33 y.o. female presenting with frequency. The history is provided by the patient. No language interpreter was used.  Urinary Frequency    HPI Comments: Jaime Dunlap is a 33 y.o. female who presents to the Emergency Department complaining of dysuria onset today. There is associated odor, urinary urgency and frequency. Patient denies history of UTI. She is currently not taking antibiotics. NKA to medications.  Past Medical History  Diagnosis Date  . Thyroid disease   . Hypertension   . Migraine    History reviewed. No pertinent past surgical history. History reviewed. No pertinent family history. History  Substance Use Topics  . Smoking status: Current Every Day Smoker -- 0.50 packs/day    Types: Cigarettes  . Smokeless tobacco: Not on file  . Alcohol Use: No   OB History   Grav Para Term Preterm Abortions TAB SAB Ect Mult Living                 Review of Systems  Genitourinary: Positive for frequency.   A complete 10 system review of systems was obtained and all systems are negative except as noted in the HPI and PMH.     Allergies  Benadryl  Home Medications   Prior to Admission medications   Medication Sig Start Date End Date Taking? Authorizing Provider  buPROPion (ZYBAN) 150 MG 12 hr tablet Take 150 mg by mouth 2 (two) times daily.   Yes Historical Provider, MD  metoprolol succinate (TOPROL-XL) 25 MG 24 hr tablet Take 25 mg by mouth daily.   Yes Historical Provider, MD  Acetaminophen (TYLENOL PO) Take by mouth.    Historical Provider, MD  permethrin (ELIMITE) 5 % cream Apply to affected area once 12/15/11   Roxy Horsemanobert  Browning, PA-C  promethazine (PHENERGAN) 25 MG tablet Take 1 tablet (25 mg total) by mouth every 6 (six) hours as needed for nausea. 01/30/12   Nelia Shiobert L Beaton, MD  traMADol (ULTRAM) 50 MG tablet Take 1 tablet (50 mg total) by mouth every 6 (six) hours as needed for pain. 01/30/12   Nelia Shiobert L Beaton, MD   Triage Vitals: BP 142/93  Pulse 82  Temp(Src) 98.4 F (36.9 C) (Oral)  Resp 20  SpO2 100%  LMP 05/24/2013  Physical Exam  Nursing note and vitals reviewed. Constitutional: She is oriented to person, place, and time. She appears well-developed and well-nourished. No distress.  HENT:  Head: Normocephalic and atraumatic.  Eyes: EOM are normal.  Neck: Neck supple. No tracheal deviation present.  Cardiovascular: Normal rate.   Pulmonary/Chest: Effort normal. No respiratory distress.  Abdominal: There is tenderness in the suprapubic area. There is no rebound and no guarding.  No flank pain.  Musculoskeletal: Normal range of motion.  Neurological: She is alert and oriented to person, place, and time.  Skin: Skin is warm and dry.  Psychiatric: She has a normal mood and affect. Her behavior is normal.    ED Course  Procedures (including critical care time)  DIAGNOSTIC STUDIES: Oxygen Saturation is 100% on room air, normal by my interpretation.    COORDINATION OF  CARE: At 1925 Discussed treatment plan with patient which includes Macrobid. Patient agrees.   Labs Review Labs Reviewed  URINALYSIS, ROUTINE W REFLEX MICROSCOPIC - Abnormal; Notable for the following:    APPearance CLOUDY (*)    Hgb urine dipstick LARGE (*)    Leukocytes, UA LARGE (*)    All other components within normal limits  URINE MICROSCOPIC-ADD ON - Abnormal; Notable for the following:    Bacteria, UA MANY (*)    All other components within normal limits  PREGNANCY, URINE    Imaging Review No results found.   EKG Interpretation None      MDM   Final diagnoses:  UTI (lower urinary tract infection)     Patient with evidence of UTI that is uncomplicated. No symptoms concerning for pyelonephritis. She denies any vaginal symptoms. UA consistent with UTI. Patient treated with Macrobid  I personally performed the services described in this documentation, which was scribed in my presence.  The recorded information has been reviewed and considered.    Gwyneth Sprout, MD 06/09/13 2326

## 2013-06-20 ENCOUNTER — Encounter (HOSPITAL_BASED_OUTPATIENT_CLINIC_OR_DEPARTMENT_OTHER): Payer: Self-pay | Admitting: Emergency Medicine

## 2013-06-20 ENCOUNTER — Emergency Department (HOSPITAL_BASED_OUTPATIENT_CLINIC_OR_DEPARTMENT_OTHER)
Admission: EM | Admit: 2013-06-20 | Discharge: 2013-06-20 | Disposition: A | Discharge status: Home / Self Care | Payer: Medicaid Other | Attending: Emergency Medicine | Admitting: Emergency Medicine

## 2013-06-20 DIAGNOSIS — N390 Urinary tract infection, site not specified: Secondary | ICD-10-CM | POA: Insufficient documentation

## 2013-06-20 DIAGNOSIS — M545 Low back pain, unspecified: Secondary | ICD-10-CM | POA: Insufficient documentation

## 2013-06-20 DIAGNOSIS — Z8639 Personal history of other endocrine, nutritional and metabolic disease: Secondary | ICD-10-CM | POA: Insufficient documentation

## 2013-06-20 DIAGNOSIS — M549 Dorsalgia, unspecified: Secondary | ICD-10-CM

## 2013-06-20 DIAGNOSIS — I10 Essential (primary) hypertension: Secondary | ICD-10-CM | POA: Insufficient documentation

## 2013-06-20 DIAGNOSIS — Z79899 Other long term (current) drug therapy: Secondary | ICD-10-CM | POA: Insufficient documentation

## 2013-06-20 DIAGNOSIS — Z862 Personal history of diseases of the blood and blood-forming organs and certain disorders involving the immune mechanism: Secondary | ICD-10-CM | POA: Insufficient documentation

## 2013-06-20 DIAGNOSIS — Z3202 Encounter for pregnancy test, result negative: Secondary | ICD-10-CM | POA: Insufficient documentation

## 2013-06-20 DIAGNOSIS — G43909 Migraine, unspecified, not intractable, without status migrainosus: Secondary | ICD-10-CM | POA: Insufficient documentation

## 2013-06-20 DIAGNOSIS — F17200 Nicotine dependence, unspecified, uncomplicated: Secondary | ICD-10-CM | POA: Insufficient documentation

## 2013-06-20 LAB — URINALYSIS, ROUTINE W REFLEX MICROSCOPIC
Glucose, UA: NEGATIVE mg/dL
Hgb urine dipstick: NEGATIVE
Ketones, ur: NEGATIVE mg/dL
Nitrite: NEGATIVE
Protein, ur: NEGATIVE mg/dL
Specific Gravity, Urine: 1.029 (ref 1.005–1.030)
Urobilinogen, UA: 1 mg/dL (ref 0.0–1.0)
pH: 5.5 (ref 5.0–8.0)

## 2013-06-20 LAB — PREGNANCY, URINE: Preg Test, Ur: NEGATIVE

## 2013-06-20 LAB — URINE MICROSCOPIC-ADD ON

## 2013-06-20 MED ORDER — HYDROCODONE-ACETAMINOPHEN 5-325 MG PO TABS: 2.0000 | ORAL_TABLET | ORAL | Status: DC | PRN

## 2013-06-20 MED ORDER — CEPHALEXIN 500 MG PO CAPS: 500.0000 mg | ORAL_CAPSULE | Freq: Four times a day (QID) | ORAL | Status: DC

## 2013-06-20 NOTE — ED Provider Notes (Signed)
CSN: [id number]     Arrival date & time 06/20/13  2134 History   First MD Initiated Contact with Patient 06/20/13 2211     Chief Complaint  Patient presents with  . Back Pain     (Consider location/radiation/quality/duration/timing/severity/associated sxs/prior Treatment) Patient is a 33 y.o. female presenting with back pain. The history is provided by the patient. No language interpreter was used.  Back Pain Location:  Lumbar spine Quality:  Aching Pain severity:  Moderate Pain is:  Same all the time Onset quality:  Unable to specify Duration:  4 days Timing:  Constant Progression:  Worsening Chronicity:  New Relieved by:  Nothing Worsened by:  Nothing tried Ineffective treatments:  None tried Associated symptoms: dysuria   Associated symptoms: no abdominal pain     Past Medical History  Diagnosis Date  . Thyroid disease   . Hypertension   . Migraine    History reviewed. No pertinent past surgical history. No family history on file. History  Substance Use Topics  . Smoking status: Current Every Day Smoker -- 0.50 packs/day    Types: Cigarettes  . Smokeless tobacco: Not on file  . Alcohol Use: No   OB History   Grav Para Term Preterm Abortions TAB SAB Ect Mult Living                 Review of Systems  Gastrointestinal: Negative for abdominal pain.  Genitourinary: Positive for dysuria.  Musculoskeletal: Positive for back pain.  All other systems reviewed and are negative.     Allergies  Benadryl  Home Medications   Prior to Admission medications   Medication Sig Start Date End Date Taking? Authorizing Provider  Acetaminophen (TYLENOL PO) Take by mouth.    Historical Provider, MD  buPROPion (ZYBAN) 150 MG 12 hr tablet Take 150 mg by mouth 2 (two) times daily.    Historical Provider, MD  metoprolol succinate (TOPROL-XL) 25 MG 24 hr tablet Take 25 mg by mouth daily.    Historical Provider, MD  nitrofurantoin, macrocrystal-monohydrate, (MACROBID) 100 MG  capsule Take 1 capsule (100 mg total) by mouth 2 (two) times daily. 06/09/13   Gwyneth Sprout, MD  permethrin (ELIMITE) 5 % cream Apply to affected area once 12/15/11   Roxy Horseman, PA-C  promethazine (PHENERGAN) 25 MG tablet Take 1 tablet (25 mg total) by mouth every 6 (six) hours as needed for nausea. 01/30/12   Nelia Shi, MD  traMADol (ULTRAM) 50 MG tablet Take 1 tablet (50 mg total) by mouth every 6 (six) hours as needed for pain. 01/30/12   Nelia Shi, MD   BP 127/70  Pulse 98  Temp(Src) 98.7 F (37.1 C) (Oral)  Resp 16  Ht 4\' 10"  (1.473 m)  Wt 135 lb (61.236 kg)  BMI 28.22 kg/m2  SpO2 100%  LMP 05/24/2013 Physical Exam  Nursing note and vitals reviewed. Constitutional: She is oriented to person, place, and time. She appears well-developed and well-nourished.  HENT:  Head: Normocephalic and atraumatic.  Right Ear: External ear normal.  Left Ear: External ear normal.  Eyes: Conjunctivae and EOM are normal. Pupils are equal, round, and reactive to light.  Neck: Normal range of motion.  Cardiovascular: Normal rate and normal heart sounds.   Pulmonary/Chest: Effort normal.  Abdominal: Soft. She exhibits no distension.  Musculoskeletal: Normal range of motion.  Neurological: She is alert and oriented to person, place, and time.  Skin: Skin is warm.  Psychiatric: She has a normal mood and affect.  ED Course  Procedures (including critical care time) Labs Review Labs Reviewed  URINALYSIS, ROUTINE W REFLEX MICROSCOPIC - Abnormal; Notable for the following:    Bilirubin Urine SMALL (*)    Leukocytes, UA SMALL (*)    All other components within normal limits  URINE MICROSCOPIC-ADD ON - Abnormal; Notable for the following:    Squamous Epithelial / LPF FEW (*)    Bacteria, UA FEW (*)    All other components within normal limits  PREGNANCY, URINE    Imaging Review No results found.   EKG Interpretation None      MDM   Final diagnoses:  Back pain   UTI (lower urinary tract infection)    ua few bacteria wbc's 3-6    Lonia SkinnerLeslie K PedricktownSofia, [place] JerseyPA-C 06/20/13 2311

## 2013-06-20 NOTE — ED Notes (Signed)
Pt given rx x 2 for keflex and norco- d/c home with ride

## 2013-06-20 NOTE — ED Notes (Signed)
Recently treated for a UTI.  Onset four days ago of non traumatic lower back pain.  Denies dysuria, reports abnormal vaginal discharge yesterday.

## 2013-06-20 NOTE — Discharge Instructions (Signed)
Back Pain, Adult Low back pain is very common. About 1 in 5 people have back pain.The cause of low back pain is rarely dangerous. The pain often gets better over time.About half of people with a sudden onset of back pain feel better in just 2 weeks. About 8 in 10 people feel better by 6 weeks.  CAUSES Some common causes of back pain include:  Strain of the muscles or ligaments supporting the spine.  Wear and tear (degeneration) of the spinal discs.  Arthritis.  Direct injury to the back. DIAGNOSIS Most of the time, the direct cause of low back pain is not known.However, back pain can be treated effectively even when the exact cause of the pain is unknown.Answering your caregiver's questions about your overall health and symptoms is one of the most accurate ways to make sure the cause of your pain is not dangerous. If your caregiver needs more information, he or she may order lab work or imaging tests (X-rays or MRIs).However, even if imaging tests show changes in your back, this usually does not require surgery. HOME CARE INSTRUCTIONS For many people, back pain returns.Since low back pain is rarely dangerous, it is often a condition that people can learn to [facility] their own.   Remain active. It is stressful on the back to sit or stand in one place. Do not sit, drive, or stand in one place for more than 30 minutes at a time. Take short walks on level surfaces as soon as pain allows.Try to increase the length of time you walk each day.  Do not stay in bed.Resting more than 1 or 2 days can delay your recovery.  Do not avoid exercise or work.Your body is made to move.It is not dangerous to be active, even though your back may hurt.Your back will likely heal faster if you return to being active before your pain is gone.  Pay attention to your body when you bend and lift. Many people have less discomfortwhen lifting if they bend their knees, keep the load close to their bodies,and  avoid twisting. Often, the most comfortable positions are those that put less stress on your recovering back.  Find a comfortable position to sleep. Use a firm mattress and lie on your side with your knees slightly bent. If you lie on your back, put a pillow under your knees.  Only take over-the-counter or prescription medicines as directed by your caregiver. Over-the-counter medicines to reduce pain and inflammation are often the most helpful.Your caregiver may prescribe muscle relaxant drugs.These medicines help dull your pain so you can more quickly return to your normal activities and healthy exercise.  Put ice on the injured area.  Put ice in a plastic bag.  Place a towel between your skin and the bag.  Leave the ice on for 15-20 minutes, 03-04 times a day for the first 2 to 3 days. After that, ice and heat may be alternated to reduce pain and spasms.  Ask your caregiver about trying back exercises and gentle massage. This may be of some benefit.  Avoid feeling anxious or stressed.Stress increases muscle tension and can worsen back pain.It is important to recognize when you are anxious or stressed and learn ways to manage it.Exercise is a great option. SEEK MEDICAL CARE IF:  You have pain that is not relieved with rest or medicine.  You have pain that does not improve in 1 week.  You have new symptoms.  You are generally not feeling well. SEEK  IMMEDIATE MEDICAL CARE IF:   You have pain that radiates from your back into your legs.  You develop new bowel or bladder control problems.  You have unusual weakness or numbness in your arms or legs.  You develop nausea or vomiting.  You develop abdominal pain.  You feel faint. Document Released: 01/01/2005 Document Revised: 07/03/2011 Document Reviewed: 05/22/2010 [facility] Patient Information 2014 [place], [place]. Asymptomatic Bacteriuria, Female Asymptomatic bacteriuria is a significant number of bacteria in your urine  that occur without the usual symptoms of burning or frequent urination. The following conditions increase risk of asymptomatic bacteriuria:  Diabetes mellitus.  Advanced age.  Pregnancy in the first trimester.  Kidney stones.  Kidney transplants.  Leaky kidney tube valve in young children (reflux). Treatment for asymptomatic bacteriuria is not required in most people and can lead to other problems such as yeast overgrowth and development of resistant bacteria. However, some people, such as pregnant women, do need treatment to prevent kidney infection. Asymptomatic bacteriuria in pregnancy is also associated with fetal growth restriction, premature labor, and newborn death. HOME CARE INSTRUCTIONS Monitor your bacteriuria for any changes. The following actions may help to alleviate any discomfort you are experiencing:  Drink enough water and fluids to keep your urine clear or pale yellow. Go to the bathroom more frequently to keep your bladder empty.  Keep the area around your vagina and rectum clean. Wipe yourself from front to back after urinating. SEEK IMMEDIATE MEDICAL CARE IF:  You develop signs of an infection such asburning with urination, frequency of voiding, back pain, or fever.  You have blood in the urine.  You develop a fever. Document Released: 01/01/2005 Document Revised: 09/03/2012 Document Reviewed: 06/23/2012 [facility] Patient Information 2014 [place], [place].

## 2013-07-02 NOTE — ED Provider Notes (Signed)
Medical screening examination/treatment/procedure(s) were performed by non-physician practitioner and as supervising physician I was immediately available for consultation/collaboration.   EKG Interpretation None        Ayn Domangue, MD 07/02/13 0012 

## 2013-07-09 ENCOUNTER — Emergency Department (HOSPITAL_BASED_OUTPATIENT_CLINIC_OR_DEPARTMENT_OTHER)
Admission: EM | Admit: 2013-07-09 | Discharge: 2013-07-09 | Disposition: A | Discharge status: Home / Self Care | Payer: Medicaid Other | Attending: Emergency Medicine | Admitting: Emergency Medicine

## 2013-07-09 ENCOUNTER — Encounter (HOSPITAL_BASED_OUTPATIENT_CLINIC_OR_DEPARTMENT_OTHER): Payer: Self-pay | Admitting: Emergency Medicine

## 2013-07-09 DIAGNOSIS — L259 Unspecified contact dermatitis, unspecified cause: Secondary | ICD-10-CM | POA: Insufficient documentation

## 2013-07-09 DIAGNOSIS — Z862 Personal history of diseases of the blood and blood-forming organs and certain disorders involving the immune mechanism: Secondary | ICD-10-CM | POA: Insufficient documentation

## 2013-07-09 DIAGNOSIS — F17200 Nicotine dependence, unspecified, uncomplicated: Secondary | ICD-10-CM | POA: Insufficient documentation

## 2013-07-09 DIAGNOSIS — Z8639 Personal history of other endocrine, nutritional and metabolic disease: Secondary | ICD-10-CM | POA: Insufficient documentation

## 2013-07-09 DIAGNOSIS — I10 Essential (primary) hypertension: Secondary | ICD-10-CM | POA: Insufficient documentation

## 2013-07-09 DIAGNOSIS — Z79899 Other long term (current) drug therapy: Secondary | ICD-10-CM | POA: Insufficient documentation

## 2013-07-09 DIAGNOSIS — G43909 Migraine, unspecified, not intractable, without status migrainosus: Secondary | ICD-10-CM | POA: Insufficient documentation

## 2013-07-09 DIAGNOSIS — Z792 Long term (current) use of antibiotics: Secondary | ICD-10-CM | POA: Insufficient documentation

## 2013-07-09 DIAGNOSIS — Z8619 Personal history of other infectious and parasitic diseases: Secondary | ICD-10-CM | POA: Insufficient documentation

## 2013-07-09 MED ORDER — PERMETHRIN 5 % EX CREA: TOPICAL_CREAM | CUTANEOUS | Status: DC

## 2013-07-09 NOTE — ED Notes (Signed)
Pt c/o bump at base of middle finger rt hand  Afraid that it is scabies

## 2013-07-09 NOTE — ED Notes (Signed)
Pt c/o rash to hands

## 2013-07-09 NOTE — ED Provider Notes (Signed)
CSN: [id number]     Arrival date & time 07/09/13  0038 History   First MD Initiated Contact with Patient 07/09/13 0105     Chief Complaint  Patient presents with  . Rash     (Consider location/radiation/quality/duration/timing/severity/associated sxs/prior Treatment) HPI Comments: Patient is a 33 year old female who presents with complaints of an itchy lesion to the top of her left hand for the past day. She states that she has had scabies in the past and is concerned that this may be recurring. She denies any new contacts or exposures.  Patient is a 33 y.o. female presenting with rash. The history is provided by the patient.  Rash Location:  Hand Hand rash location:  R hand Severity:  Mild Onset quality:  Sudden Duration:  1 day Timing:  Constant   Past Medical History  Diagnosis Date  . Thyroid disease   . Hypertension   . Migraine    History reviewed. No pertinent past surgical history. History reviewed. No pertinent family history. History  Substance Use Topics  . Smoking status: Current Every Day Smoker -- 0.50 packs/day    Types: Cigarettes  . Smokeless tobacco: Not on file  . Alcohol Use: No   OB History   Grav Para Term Preterm Abortions TAB SAB Ect Mult Living                 Review of Systems  Skin: Positive for rash.  All other systems reviewed and are negative.     Allergies  Benadryl  Home Medications   Prior to Admission medications   Medication Sig Start Date End Date Taking? Authorizing Provider  Acetaminophen (TYLENOL PO) Take by mouth.    Historical Provider, MD  buPROPion (ZYBAN) 150 MG 12 hr tablet Take 150 mg by mouth 2 (two) times daily.    Historical Provider, MD  cephALEXin (KEFLEX) 500 MG capsule Take 1 capsule (500 mg total) by mouth 4 (four) times daily. 06/20/13   Elson AreasLeslie K Sofia, PA-C  HYDROcodone-acetaminophen (NORCO/VICODIN) 5-325 MG per tablet Take 2 tablets by mouth every 4 (four) hours as needed. 06/20/13   Elson AreasLeslie K Sofia, PA-C   metoprolol succinate (TOPROL-XL) 25 MG 24 hr tablet Take 25 mg by mouth daily.    Historical Provider, MD  nitrofurantoin, macrocrystal-monohydrate, (MACROBID) 100 MG capsule Take 1 capsule (100 mg total) by mouth 2 (two) times daily. 06/09/13   Gwyneth SproutWhitney Plunkett, MD  permethrin (ELIMITE) 5 % cream Apply to affected area once 12/15/11   Roxy Horsemanobert Browning, PA-C  promethazine (PHENERGAN) 25 MG tablet Take 1 tablet (25 mg total) by mouth every 6 (six) hours as needed for nausea. 01/30/12   Nelia Shiobert L Beaton, MD  traMADol (ULTRAM) 50 MG tablet Take 1 tablet (50 mg total) by mouth every 6 (six) hours as needed for pain. 01/30/12   Nelia Shiobert L Beaton, MD   BP 129/83  Pulse 81  Temp(Src) 98.4 F (36.9 C)  Resp 16  Ht 4\' 10"  (1.473 m)  Wt 130 lb (58.968 kg)  BMI 27.18 kg/m2  SpO2 100%  LMP 05/24/2013 Physical Exam  Nursing note and vitals reviewed. Constitutional: She is oriented to person, place, and time. She appears well-developed and well-nourished. No distress.  HENT:  Head: Normocephalic and atraumatic.  Neck: Normal range of motion. Neck supple.  Neurological: She is alert and oriented to person, place, and time.  Skin: Skin is warm and dry. She is not diaphoretic.  There is a very small punctate, red lesion to  the space between the second and third distal metacarpal. This is pruritic and blanching.    ED Course  Procedures (including critical care time) Labs Review Labs Reviewed - No data to display  Imaging Review No results found.   EKG Interpretation None      MDM   Final diagnoses:  None    I am uncertain as to the knowledge of this lesion. I will recommend topical Benadryl and warm soaks. If this does not improve in the next 2 days or if she develops other lesions between her fingers, I will prescribe Elimite which she can fill.    Geoffery Lyonsouglas King Pinzon, MD 07/09/13 [id number]

## 2013-07-09 NOTE — Discharge Instructions (Signed)
Topical Benadryl for the next 2 days. This is available over-the-counter.  Also recommend warm soaks several times daily.  If not improving in the next 2 days, or if you develop additional lesions, fill the prescription for permethrin which you have been provided.   Contact Dermatitis Contact dermatitis is a rash that happens when something touches the skin. You touched something that irritates your skin, or you have allergies to something you touched. HOME CARE   Avoid the thing that caused your rash.  Keep your rash away from hot water, soap, sunlight, chemicals, and other things that might bother it.  Do not scratch your rash.  You can take cool baths to help stop itching.  Only take medicine as told by your doctor.  Keep all doctor visits as told. GET HELP RIGHT AWAY IF:   Your rash is not better after 3 days.  Your rash gets worse.  Your rash is puffy (swollen), tender, red, sore, or warm.  You have problems with your medicine. MAKE SURE YOU:   Understand these instructions.  Will watch your condition.  Will get help right away if you are not doing well or get worse. Document Released: 10/29/2008 Document Revised: 03/26/2011 Document Reviewed: 06/06/2010 [facility] Patient Information 2015 [place]. This information is not intended to replace advice given to you by your health care provider. Make sure you discuss any questions you have with your health care provider.

## 2014-02-02 ENCOUNTER — Emergency Department (HOSPITAL_BASED_OUTPATIENT_CLINIC_OR_DEPARTMENT_OTHER)
Admission: EM | Admit: 2014-02-02 | Discharge: 2014-02-02 | Disposition: A | Discharge status: Home / Self Care | Payer: Medicaid Other | Attending: Emergency Medicine | Admitting: Emergency Medicine

## 2014-02-02 ENCOUNTER — Encounter (HOSPITAL_BASED_OUTPATIENT_CLINIC_OR_DEPARTMENT_OTHER): Payer: Self-pay | Admitting: Emergency Medicine

## 2014-02-02 DIAGNOSIS — N898 Other specified noninflammatory disorders of vagina: Secondary | ICD-10-CM | POA: Insufficient documentation

## 2014-02-02 DIAGNOSIS — Z792 Long term (current) use of antibiotics: Secondary | ICD-10-CM | POA: Diagnosis not present

## 2014-02-02 DIAGNOSIS — I10 Essential (primary) hypertension: Secondary | ICD-10-CM | POA: Diagnosis not present

## 2014-02-02 DIAGNOSIS — N390 Urinary tract infection, site not specified: Secondary | ICD-10-CM | POA: Diagnosis not present

## 2014-02-02 DIAGNOSIS — Z79899 Other long term (current) drug therapy: Secondary | ICD-10-CM | POA: Diagnosis not present

## 2014-02-02 DIAGNOSIS — Z3202 Encounter for pregnancy test, result negative: Secondary | ICD-10-CM | POA: Diagnosis not present

## 2014-02-02 DIAGNOSIS — Z720 Tobacco use: Secondary | ICD-10-CM | POA: Insufficient documentation

## 2014-02-02 DIAGNOSIS — R300 Dysuria: Secondary | ICD-10-CM | POA: Diagnosis present

## 2014-02-02 LAB — URINALYSIS, ROUTINE W REFLEX MICROSCOPIC
Bilirubin Urine: NEGATIVE
Glucose, UA: NEGATIVE mg/dL
Ketones, ur: NEGATIVE mg/dL
Nitrite: NEGATIVE
Protein, ur: 30 mg/dL — AB
Specific Gravity, Urine: 1.021 (ref 1.005–1.030)
Urobilinogen, UA: 1 mg/dL (ref 0.0–1.0)
pH: 7 (ref 5.0–8.0)

## 2014-02-02 LAB — URINE MICROSCOPIC-ADD ON

## 2014-02-02 LAB — PREGNANCY, URINE: Preg Test, Ur: NEGATIVE

## 2014-02-02 MED ORDER — PHENAZOPYRIDINE HCL 200 MG PO TABS: 200.0000 mg | ORAL_TABLET | Freq: Three times a day (TID) | ORAL | Status: DC

## 2014-02-02 MED ORDER — NITROFURANTOIN MONOHYD MACRO 100 MG PO CAPS: 100.0000 mg | ORAL_CAPSULE | Freq: Two times a day (BID) | ORAL | Status: DC

## 2014-02-02 NOTE — Discharge Instructions (Signed)

## 2014-02-02 NOTE — ED Notes (Signed)
Pt states she thinks she has a UTI.  Burning with urination, frequency and pain.

## 2014-02-02 NOTE — ED Provider Notes (Signed)
CSN: [id number]     Arrival date & time 02/02/14  2243 History  This chart was scribed for Glynn Octave, MD by Evon Slack, ED Scribe. This patient was seen in room MH03/MH03 and the patient's care was started at 11:17 PM.     Chief Complaint  Patient presents with  . Urinary Tract Infection   The history is provided by the patient. No language interpreter was used.   HPI Comments: Jaime Dunlap is a 34 y.o. female who presents to the Emergency Department complaining of burning dysuria onset 2 days prior. Pt states she has associated frequency, abdominal pain and vaginal discharge.  Pt states that her symptoms feel similar to previous UTI. Denies fever, vomiting, hematuria, back pain. Denies any surgeries on abdomen. LMP 01/14/14   Past Medical History  Diagnosis Date  . Thyroid disease   . Hypertension   . Migraine    History reviewed. No pertinent past surgical history. History reviewed. No pertinent family history. History  Substance Use Topics  . Smoking status: Current Every Day Smoker -- 0.50 packs/day    Types: Cigarettes  . Smokeless tobacco: Not on file  . Alcohol Use: No   OB History    No data available      Review of Systems  Constitutional: Negative for fever.  Gastrointestinal: Positive for abdominal pain. Negative for vomiting.  Genitourinary: Positive for dysuria, frequency and vaginal discharge. Negative for hematuria.  Musculoskeletal: Negative for back pain.  All other systems reviewed and are negative.     Allergies  Benadryl  Home Medications   Prior to Admission medications   Medication Sig Start Date End Date Taking? Authorizing Provider  Acetaminophen (TYLENOL PO) Take by mouth.    Historical Provider, MD  buPROPion (ZYBAN) 150 MG 12 hr tablet Take 150 mg by mouth 2 (two) times daily.    Historical Provider, MD  cephALEXin (KEFLEX) 500 MG capsule Take 1 capsule (500 mg total) by mouth 4 (four) times daily. 06/20/13   Elson Areas,  PA-C  HYDROcodone-acetaminophen (NORCO/VICODIN) 5-325 MG per tablet Take 2 tablets by mouth every 4 (four) hours as needed. 06/20/13   Elson Areas, PA-C  metoprolol succinate (TOPROL-XL) 25 MG 24 hr tablet Take 25 mg by mouth daily.    Historical Provider, MD  nitrofurantoin, macrocrystal-monohydrate, (MACROBID) 100 MG capsule Take 1 capsule (100 mg total) by mouth 2 (two) times daily. 02/02/14   Glynn Octave, MD  permethrin (ELIMITE) 5 % cream Apply to affected area once 12/15/11   Roxy Horseman, PA-C  permethrin (ELIMITE) 5 % cream Apply to affected area once 07/09/13   Geoffery Lyons, MD  phenazopyridine (PYRIDIUM) 200 MG tablet Take 1 tablet (200 mg total) by mouth 3 (three) times daily. 02/02/14   Glynn Octave, MD  promethazine (PHENERGAN) 25 MG tablet Take 1 tablet (25 mg total) by mouth every 6 (six) hours as needed for nausea. 01/30/12   Nelia Shi, MD  traMADol (ULTRAM) 50 MG tablet Take 1 tablet (50 mg total) by mouth every 6 (six) hours as needed for pain. 01/30/12   Nelia Shi, MD   BP 137/81 mmHg  Pulse 90  Temp(Src) 98.1 F (36.7 C) (Oral)  Resp 18  Ht  (1.473 m)  Wt 130 lb (58.968 kg)  BMI 27.18 kg/m2  SpO2 100%  LMP 01/19/2014   Physical Exam  Constitutional: She is oriented to person, place, and time. She appears well-developed and well-nourished. No distress.  HENT:  Head: Normocephalic and atraumatic.  Mouth/Throat: Oropharynx is clear and moist. No oropharyngeal exudate.  Eyes: Conjunctivae and EOM are normal. Pupils are equal, round, and reactive to light.  Neck: Normal range of motion. Neck supple.  No meningismus.  Cardiovascular: Normal rate, regular rhythm, normal heart sounds and intact distal pulses.   No murmur heard. Pulmonary/Chest: Effort normal and breath sounds normal. No respiratory distress.  Abdominal: Soft. There is tenderness in the suprapubic area. There is no rebound, no guarding and no CVA tenderness.  No RLQ tenderness.    Musculoskeletal: Normal range of motion. She exhibits no edema or tenderness.  Neurological: She is alert and oriented to person, place, and time. No cranial nerve deficit. She exhibits normal muscle tone. Coordination normal.  No ataxia on finger to nose bilaterally. No pronator drift. 5/5 strength throughout. CN 2-12 intact. Negative Romberg. Equal grip strength. Sensation intact. Gait is normal.   Skin: Skin is warm.  Psychiatric: She has a normal mood and affect. Her behavior is normal.  Nursing note and vitals reviewed.   ED Course  Procedures (including critical care time) DIAGNOSTIC STUDIES: Oxygen Saturation is 100% on RA, normal by my interpretation.    COORDINATION OF CARE: 11:20 PM-Discussed treatment plan with pt at bedside and pt agreed to plan.     Labs Review Labs Reviewed  URINALYSIS, ROUTINE W REFLEX MICROSCOPIC - Abnormal; Notable for the following:    APPearance CLOUDY (*)    Hgb urine dipstick SMALL (*)    Protein, ur 30 (*)    Leukocytes, UA LARGE (*)    All other components within normal limits  URINE MICROSCOPIC-ADD ON - Abnormal; Notable for the following:    Bacteria, UA MANY (*)    All other components within normal limits  PREGNANCY, URINE    Imaging Review No results found.   EKG Interpretation None      MDM   Final diagnoses:  Urinary tract infection without hematuria, site unspecified    2 day history of dysuria, frequency, urgency similar to previous UTIs. No fever or vomiting.  Superpubic tenderness without CVA tenderness. Urinalysis consistent with infection. Pregnancy test negative. Patient endorses vaginal discharge but declines pelvic exam.  Treat UTI with Macrobid. Return precautions discussed.  I personally performed the services described in this documentation, which was scribed in my presence. The recorded information has been reviewed and is accurate.    Glynn OctaveStephen Jamonta Goerner, MD 02/02/14 [phone]

## 2014-04-22 ENCOUNTER — Encounter (HOSPITAL_BASED_OUTPATIENT_CLINIC_OR_DEPARTMENT_OTHER): Payer: Self-pay | Admitting: *Deleted

## 2014-04-22 ENCOUNTER — Emergency Department (HOSPITAL_BASED_OUTPATIENT_CLINIC_OR_DEPARTMENT_OTHER)
Admission: EM | Admit: 2014-04-22 | Discharge: 2014-04-22 | Disposition: A | Discharge status: Home / Self Care | Payer: Medicaid Other | Attending: Emergency Medicine | Admitting: Emergency Medicine

## 2014-04-22 DIAGNOSIS — Z3202 Encounter for pregnancy test, result negative: Secondary | ICD-10-CM | POA: Diagnosis not present

## 2014-04-22 DIAGNOSIS — Z79899 Other long term (current) drug therapy: Secondary | ICD-10-CM | POA: Diagnosis not present

## 2014-04-22 DIAGNOSIS — N390 Urinary tract infection, site not specified: Secondary | ICD-10-CM | POA: Insufficient documentation

## 2014-04-22 DIAGNOSIS — Z8639 Personal history of other endocrine, nutritional and metabolic disease: Secondary | ICD-10-CM | POA: Insufficient documentation

## 2014-04-22 DIAGNOSIS — Z792 Long term (current) use of antibiotics: Secondary | ICD-10-CM | POA: Diagnosis not present

## 2014-04-22 DIAGNOSIS — Z8679 Personal history of other diseases of the circulatory system: Secondary | ICD-10-CM | POA: Insufficient documentation

## 2014-04-22 DIAGNOSIS — N898 Other specified noninflammatory disorders of vagina: Secondary | ICD-10-CM | POA: Diagnosis present

## 2014-04-22 DIAGNOSIS — B373 Candidiasis of vulva and vagina: Secondary | ICD-10-CM | POA: Diagnosis not present

## 2014-04-22 DIAGNOSIS — B3731 Acute candidiasis of vulva and vagina: Secondary | ICD-10-CM

## 2014-04-22 DIAGNOSIS — Z720 Tobacco use: Secondary | ICD-10-CM | POA: Insufficient documentation

## 2014-04-22 LAB — WET PREP, GENITAL
Clue Cells Wet Prep HPF POC: NONE SEEN
Trich, Wet Prep: NONE SEEN

## 2014-04-22 LAB — URINALYSIS, ROUTINE W REFLEX MICROSCOPIC
Glucose, UA: NEGATIVE mg/dL
Hgb urine dipstick: NEGATIVE
KETONES UR: 15 mg/dL — AB
NITRITE: NEGATIVE
PH: 6.5 (ref 5.0–8.0)
PROTEIN: NEGATIVE mg/dL
Specific Gravity, Urine: 1.022 (ref 1.005–1.030)
Urobilinogen, UA: 1 mg/dL (ref 0.0–1.0)

## 2014-04-22 LAB — URINE MICROSCOPIC-ADD ON

## 2014-04-22 LAB — PREGNANCY, URINE: PREG TEST UR: NEGATIVE

## 2014-04-22 MED ORDER — FLUCONAZOLE 200 MG PO TABS: 200.0000 mg | ORAL_TABLET | Freq: Every day | ORAL | Status: AC

## 2014-04-22 MED ORDER — CEPHALEXIN 500 MG PO CAPS: 500.0000 mg | ORAL_CAPSULE | Freq: Four times a day (QID) | ORAL | Status: DC

## 2014-04-22 MED ORDER — FLUCONAZOLE 50 MG PO TABS
150.0000 mg | ORAL_TABLET | Freq: Once | ORAL | Status: AC
  Administered 2014-04-22: 150 mg via ORAL
  Filled 2014-04-22 (×2): qty 1

## 2014-04-22 NOTE — Discharge Instructions (Signed)
Candidal Vulvovaginitis °Candidal vulvovaginitis is an infection of the vagina and vulva. The vulva is the skin around the opening of the vagina. This may cause itching and discomfort in and around the vagina.  °HOME CARE °· Only take medicine as told by your doctor. °· Do not have sex (intercourse) until the infection is healed or as told by your doctor. °· Practice safe sex. °· Tell your sex partner about your infection. °· Do not douche or use tampons. °· Wear cotton underwear. Do not wear tight pants or panty hose. °· Eat yogurt. This may help treat and prevent yeast infections. °GET HELP RIGHT AWAY IF:  °· You have a fever. °· Your problems get worse during treatment or do not get better in 3 days. °· You have discomfort, irritation, or itching in your vagina or vulva area. °· You have pain after sex. °· You start to get belly (abdominal) pain. °MAKE SURE YOU: °· Understand these instructions. °· Will watch your condition. °· Will get help right away if you are not doing well or get worse. °Document Released: 03/30/2008 Document Revised: 01/06/2013 Document Reviewed: 03/30/2008 °ExitCare® Patient Information ©2015 [employer]. This information is not intended to replace advice given to you by your health care provider. Make sure you discuss any questions you have with your health care provider. ° °Urinary Tract Infection °Urinary tract infections (UTIs) can develop anywhere along your urinary tract. Your urinary tract is your body's drainage system for removing wastes and extra water. Your urinary tract includes two kidneys, two ureters, a bladder, and a urethra. Your kidneys are a pair of bean-shaped organs. Each kidney is about the size of your fist. They are located below your ribs, one on each side of your spine. °CAUSES °Infections are caused by microbes, which are microscopic organisms, including fungi, viruses, and bacteria. These organisms are so small that they can only be seen through a microscope.  Bacteria are the microbes that most commonly cause UTIs. °SYMPTOMS  °Symptoms of UTIs may vary by age and gender of the patient and by the location of the infection. Symptoms in young women typically include a frequent and intense urge to urinate and a painful, burning feeling in the bladder or urethra during urination. Older women and men are more likely to be tired, shaky, and weak and have muscle aches and abdominal pain. A fever may mean the infection is in your kidneys. Other symptoms of a kidney infection include pain in your back or sides below the ribs, nausea, and vomiting. °DIAGNOSIS °To diagnose a UTI, your caregiver will ask you about your symptoms. Your caregiver also will ask to provide a urine sample. The urine sample will be tested for bacteria and white blood cells. White blood cells are made by your body to help fight infection. °TREATMENT  °Typically, UTIs can be treated with medication. Because most UTIs are caused by a bacterial infection, they usually can be treated with the use of antibiotics. The choice of antibiotic and length of treatment depend on your symptoms and the type of bacteria causing your infection. °HOME CARE INSTRUCTIONS °· If you were prescribed antibiotics, take them exactly as your caregiver instructs you. Finish the medication even if you feel better after you have only taken some of the medication. °· Drink enough water and fluids to keep your urine clear or pale yellow. °· Avoid caffeine, tea, and carbonated beverages. They tend to irritate your bladder. °· Empty your bladder often. Avoid holding urine for   periods of time.  Empty your bladder before and after sexual intercourse.  After a bowel movement, women should cleanse from front to back. Use each tissue only once. SEEK MEDICAL CARE IF:   You have back pain.  You develop a fever.  Your symptoms do not begin to resolve within 3 days. SEEK IMMEDIATE MEDICAL CARE IF:   You have severe back pain or  lower abdominal pain.  You develop chills.  You have nausea or vomiting.  You have continued burning or discomfort with urination. MAKE SURE YOU:   Understand these instructions.  Will watch your condition.  Will get help right away if you are not doing well or get worse. Document Released: 10/11/2004 Document Revised: 07/03/2011 Document Reviewed: 02/09/2011 [facility] Patient Information 2015 [place], [place]. This information is not intended to replace advice given to you by your health care provider. Make sure you discuss any questions you have with your health care provider.

## 2014-04-22 NOTE — ED Notes (Signed)
Vaginal itching started 2 days followed by burning after using otc monostat cream. Vaginal swelling after the cream.

## 2014-04-22 NOTE — ED Provider Notes (Signed)
CSN: [id number]     Arrival date & time 04/22/14  1605 History   First MD Initiated Contact with Patient 04/22/14 1753     Chief Complaint  Patient presents with  . Vaginal Discharge     (Consider location/radiation/quality/duration/timing/severity/associated sxs/prior Treatment) Patient is a 34 y.o. female presenting with vaginal itching. The history is provided by the patient. No language interpreter was used.  Vaginal Itching This is a new problem. The current episode started 2 days ago. The problem occurs constantly. The problem has been gradually worsening. Pertinent negatives include no headaches. Nothing aggravates the symptoms. Nothing relieves the symptoms. She has tried nothing for the symptoms. The treatment provided no relief.  Pt complains of vaginal pain and discharge.  Pt used monistat.  Pt complains of swelling and pain  Past Medical History  Diagnosis Date  . Thyroid disease   . Hypertension   . Migraine    History reviewed. No pertinent past surgical history. No family history on file. History  Substance Use Topics  . Smoking status: Current Every Day Smoker -- 0.50 packs/day    Types: Cigarettes  . Smokeless tobacco: Not on file  . Alcohol Use: No   OB History    No data available     Review of Systems  Neurological: Negative for headaches.  All other systems reviewed and are negative.     Allergies  Benadryl  Home Medications   Prior to Admission medications   Medication Sig Start Date End Date Taking? Authorizing Provider  Acetaminophen (TYLENOL PO) Take by mouth.    Historical Provider, MD  buPROPion (ZYBAN) 150 MG 12 hr tablet Take 150 mg by mouth 2 (two) times daily.    Historical Provider, MD  cephALEXin (KEFLEX) 500 MG capsule Take 1 capsule (500 mg total) by mouth 4 (four) times daily. 06/20/13   Elson AreasLeslie K Salimatou Simone, PA-C  HYDROcodone-acetaminophen (NORCO/VICODIN) 5-325 MG per tablet Take 2 tablets by mouth every 4 (four) hours as needed. 06/20/13    Elson AreasLeslie K Annia Gomm, PA-C  metoprolol succinate (TOPROL-XL) 25 MG 24 hr tablet Take 25 mg by mouth daily.    Historical Provider, MD  nitrofurantoin, macrocrystal-monohydrate, (MACROBID) 100 MG capsule Take 1 capsule (100 mg total) by mouth 2 (two) times daily. 02/02/14   Glynn OctaveStephen Rancour, MD  permethrin (ELIMITE) 5 % cream Apply to affected area once 12/15/11   Roxy Horsemanobert Browning, PA-C  permethrin (ELIMITE) 5 % cream Apply to affected area once 07/09/13   Geoffery Lyonsouglas Delo, MD  phenazopyridine (PYRIDIUM) 200 MG tablet Take 1 tablet (200 mg total) by mouth 3 (three) times daily. 02/02/14   Glynn OctaveStephen Rancour, MD  promethazine (PHENERGAN) 25 MG tablet Take 1 tablet (25 mg total) by mouth every 6 (six) hours as needed for nausea. 01/30/12   Nelva Nayobert Beaton, MD  traMADol (ULTRAM) 50 MG tablet Take 1 tablet (50 mg total) by mouth every 6 (six) hours as needed for pain. 01/30/12   Nelva Nayobert Beaton, MD   BP 137/85 mmHg  Pulse 97  Temp(Src) 98.6 F (37 C)  Resp 18  Ht 4\' 10"  (1.473 m)  Wt 134 lb 9 oz (61.037 kg)  BMI 28.13 kg/m2  SpO2 100%  LMP 03/17/2014 Physical Exam  Constitutional: She appears well-developed and well-nourished.  HENT:  Head: Normocephalic and atraumatic.  Eyes: Conjunctivae are normal. Pupils are equal, round, and reactive to light.  Neck: Normal range of motion. Neck supple.  Cardiovascular: Normal rate.   Pulmonary/Chest: Effort normal.  Genitourinary: Vaginal discharge found.  Thick white vaginal discharge, swollen external labia, thick white stick discharge vaginal vault  Musculoskeletal: Normal range of motion.  Neurological: She is alert.  Skin: Skin is warm.  Nursing note and vitals reviewed.   ED Course  Procedures (including critical care time) Labs Review Labs Reviewed  URINALYSIS, ROUTINE W REFLEX MICROSCOPIC - Abnormal; Notable for the following:    APPearance CLOUDY (*)    Bilirubin Urine SMALL (*)    Ketones, ur 15 (*)    Leukocytes, UA LARGE (*)    All other components  within normal limits  URINE MICROSCOPIC-ADD ON - Abnormal; Notable for the following:    Squamous Epithelial / LPF FEW (*)    Bacteria, UA FEW (*)    All other components within normal limits  WET PREP, GENITAL  PREGNANCY, URINE  GC/CHLAMYDIA PROBE AMP ([place])    Imaging Review No results found.   EKG Interpretation None      MDM   Final diagnoses:  Yeast vaginitis  UTI (lower urinary tract infection)    Pt given rx for keflex for uti.   Pt given diflucan.  Because I am putting on and antibiotic I will give additional diflucan    Elson Areas, PA-C 04/23/14 1438  [address] [place], [place] 04/23/14 1438  Nelva Nay, MD 04/27/14 1821

## 2014-04-23 ENCOUNTER — Emergency Department (HOSPITAL_BASED_OUTPATIENT_CLINIC_OR_DEPARTMENT_OTHER)
Admission: EM | Admit: 2014-04-23 | Discharge: 2014-04-24 | Disposition: A | Discharge status: Home / Self Care | Payer: Medicaid Other | Attending: Emergency Medicine | Admitting: Emergency Medicine

## 2014-04-23 ENCOUNTER — Encounter (HOSPITAL_BASED_OUTPATIENT_CLINIC_OR_DEPARTMENT_OTHER): Payer: Self-pay | Admitting: *Deleted

## 2014-04-23 DIAGNOSIS — A549 Gonococcal infection, unspecified: Secondary | ICD-10-CM

## 2014-04-23 DIAGNOSIS — Z720 Tobacco use: Secondary | ICD-10-CM | POA: Diagnosis not present

## 2014-04-23 DIAGNOSIS — G43909 Migraine, unspecified, not intractable, without status migrainosus: Secondary | ICD-10-CM | POA: Insufficient documentation

## 2014-04-23 DIAGNOSIS — Z792 Long term (current) use of antibiotics: Secondary | ICD-10-CM | POA: Insufficient documentation

## 2014-04-23 DIAGNOSIS — N898 Other specified noninflammatory disorders of vagina: Secondary | ICD-10-CM | POA: Diagnosis present

## 2014-04-23 DIAGNOSIS — Z79899 Other long term (current) drug therapy: Secondary | ICD-10-CM | POA: Diagnosis not present

## 2014-04-23 DIAGNOSIS — I10 Essential (primary) hypertension: Secondary | ICD-10-CM | POA: Diagnosis not present

## 2014-04-23 DIAGNOSIS — Z8639 Personal history of other endocrine, nutritional and metabolic disease: Secondary | ICD-10-CM | POA: Insufficient documentation

## 2014-04-23 LAB — GC/CHLAMYDIA PROBE AMP (~~LOC~~) NOT AT ARMC
Chlamydia: NEGATIVE
Neisseria Gonorrhea: POSITIVE — AB

## 2014-04-23 MED ORDER — FLUCONAZOLE 150 MG PO TABS: 150.0000 mg | ORAL_TABLET | Freq: Once | ORAL | Status: DC

## 2014-04-23 MED ORDER — AZITHROMYCIN 1 G PO PACK
1.0000 g | PACK | Freq: Once | ORAL | Status: AC
  Administered 2014-04-23: 1 g via ORAL
  Filled 2014-04-23: qty 1

## 2014-04-23 MED ORDER — CEFTRIAXONE SODIUM 250 MG IJ SOLR
250.0000 mg | Freq: Once | INTRAMUSCULAR | Status: AC
  Administered 2014-04-23: 250 mg via INTRAMUSCULAR
  Filled 2014-04-23: qty 250

## 2014-04-23 MED ORDER — LIDOCAINE HCL (PF) 1 % IJ SOLN
INTRAMUSCULAR | Status: AC
  Filled 2014-04-23: qty 5

## 2014-04-23 MED ORDER — DOXYCYCLINE HYCLATE 100 MG PO CAPS: 100.0000 mg | ORAL_CAPSULE | Freq: Two times a day (BID) | ORAL | Status: DC

## 2014-04-23 NOTE — ED Notes (Signed)
Pt c/o vaginal pain and discharge seen here last night for same

## 2014-04-23 NOTE — ED Provider Notes (Signed)
CSN: [id number]     Arrival date & time 04/23/14  2320 History  This chart was scribed for Qiara Minetti, MD by Elveria Rising, ED scribe.  This patient was seen in room MH08/MH08 and the patient's care was started at 11:41 PM.   Chief Complaint  Patient presents with  . Vaginal Discharge   Patient is a 34 y.o. female presenting with vaginal discharge. The history is provided by the patient. No language interpreter was used.  Vaginal Discharge Quality:  White Severity:  Moderate Onset quality:  Gradual Timing:  Constant Progression:  Unchanged Chronicity:  New Context: spontaneously   Relieved by:  Nothing Worsened by:  Nothing tried Ineffective treatments:  OTC medications and prescription medications Associated symptoms: no fever, no nausea and no vomiting   Risk factors: unprotected sex    HPI Comments: Jaime Dunlap is a 34 y.o. female who presents to the Emergency Department complaining of continued vaginal discharge and vaginal pain. Patient evaluated yesterday for complaints of thick white vaignal discharge. Patient with positive culture for Gonorrhea from that visit in Epic.  States monistat and diflucan have not affected discharge.      Past Medical History  Diagnosis Date  . Thyroid disease   . Hypertension   . Migraine    History reviewed. No pertinent past surgical history. History reviewed. No pertinent family history. History  Substance Use Topics  . Smoking status: Current Every Day Smoker -- 0.50 packs/day    Types: Cigarettes  . Smokeless tobacco: Not on file  . Alcohol Use: No   OB History    No data available     Review of Systems  Constitutional: Negative for fever and chills.  Respiratory: Negative for cough.   Cardiovascular: Negative for chest pain.  Gastrointestinal: Negative for nausea, vomiting and diarrhea.  Genitourinary: Positive for vaginal discharge and vaginal pain.  Skin: Negative for rash.  All other systems reviewed and are  negative.     Allergies  Benadryl  Home Medications   Prior to Admission medications   Medication Sig Start Date End Date Taking? Authorizing Provider  Acetaminophen (TYLENOL PO) Take by mouth.    Historical Provider, MD  buPROPion (ZYBAN) 150 MG 12 hr tablet Take 150 mg by mouth 2 (two) times daily.    Historical Provider, MD  cephALEXin (KEFLEX) 500 MG capsule Take 1 capsule (500 mg total) by mouth 4 (four) times daily. 04/22/14   Elson Areas, PA-C  fluconazole (DIFLUCAN) 200 MG tablet Take 1 tablet (200 mg total) by mouth daily. 04/22/14 04/29/14  Elson Areas, PA-C  HYDROcodone-acetaminophen (NORCO/VICODIN) 5-325 MG per tablet Take 2 tablets by mouth every 4 (four) hours as needed. 06/20/13   Elson Areas, PA-C  metoprolol succinate (TOPROL-XL) 25 MG 24 hr tablet Take 25 mg by mouth daily.    Historical Provider, MD  nitrofurantoin, macrocrystal-monohydrate, (MACROBID) 100 MG capsule Take 1 capsule (100 mg total) by mouth 2 (two) times daily. 02/02/14   Glynn Octave, MD  permethrin (ELIMITE) 5 % cream Apply to affected area once 12/15/11   Roxy Horseman, PA-C  permethrin (ELIMITE) 5 % cream Apply to affected area once 07/09/13   Geoffery Lyons, MD  phenazopyridine (PYRIDIUM) 200 MG tablet Take 1 tablet (200 mg total) by mouth 3 (three) times daily. 02/02/14   Glynn Octave, MD  promethazine (PHENERGAN) 25 MG tablet Take 1 tablet (25 mg total) by mouth every 6 (six) hours as needed for nausea. 01/30/12   Nelva Nay,  MD  traMADol (ULTRAM) 50 MG tablet Take 1 tablet (50 mg total) by mouth every 6 (six) hours as needed for pain. 01/30/12   Nelva Nayobert Beaton, MD   Triage Vitals: BP 123/87 mmHg  Pulse 91  Temp(Src) 98.1 F (36.7 C) (Oral)  Resp 16  SpO2 100%  LMP 03/17/2014 Physical Exam  Constitutional: She is oriented to person, place, and time. She appears well-developed and well-nourished. No distress.  HENT:  Head: Normocephalic and atraumatic.  Eyes: EOM are normal. Pupils are  equal, round, and reactive to light.  Neck: Normal range of motion. Neck supple.  Cardiovascular: Normal rate, regular rhythm and normal heart sounds.   Pulmonary/Chest: Effort normal and breath sounds normal. No respiratory distress. She has no wheezes. She has no rales. She exhibits no tenderness.  Abdominal: Soft. Bowel sounds are normal. She exhibits no distension and no mass. There is no tenderness. There is no rebound and no guarding.  Genitourinary:  deferred  Musculoskeletal: Normal range of motion.  Neurological: She is alert and oriented to person, place, and time.  Skin: Skin is warm and dry.  Psychiatric: She has a normal mood and affect. Her behavior is normal.  Nursing note and vitals reviewed.   ED Course  Procedures (including critical care time)  COORDINATION OF CARE: 11:41 PM- Will discharge with two week course of antibiotics and Diflucan. Greek yogurt 3 times a day recommended for yeast infection. Discussed treatment plan with patient at bedside and patient agreed to plan.   Labs Review Labs Reviewed - No data to display  Imaging Review No results found.   EKG Interpretation None      MDM   Final diagnoses:  Gonorrhea   GC probe from visit yesterday reported as positive.  Will treat  EDP had lengthy discussion with the patient with nurse Fannie KneeSue present and advised of the result and that we will treat her for same.    Medications  cefTRIAXone (ROCEPHIN) injection 250 mg (250 mg Intramuscular Given 04/23/14 2343)  azithromycin (ZITHROMAX) powder 1 g (1 g Oral Given 04/23/14 2343)   Will treat for 2 weeks with doxycycline.  Have advised [place] yogurt to minimize yeast infection.  D/c Keflex.  Inform all partners.  No sexual activity of any kind until 7 days after all partners treated.  Diflucan 150 mg tab for following course of antibiotics  I personally performed the services described in this documentation, which was scribed in my presence. The recorded  information has been reviewed and is accurate.     Cy BlamerApril Millicent Blazejewski, MD 04/24/14 [phone]

## 2014-04-23 NOTE — ED Notes (Signed)
Pt called requesting an extension to work note. Note will not be provided.

## 2014-04-24 ENCOUNTER — Encounter (HOSPITAL_BASED_OUTPATIENT_CLINIC_OR_DEPARTMENT_OTHER): Payer: Self-pay | Admitting: Emergency Medicine

## 2014-09-10 ENCOUNTER — Emergency Department (HOSPITAL_BASED_OUTPATIENT_CLINIC_OR_DEPARTMENT_OTHER): Payer: Medicaid Other

## 2014-09-10 ENCOUNTER — Emergency Department (HOSPITAL_BASED_OUTPATIENT_CLINIC_OR_DEPARTMENT_OTHER)
Admission: EM | Admit: 2014-09-10 | Discharge: 2014-09-11 | Disposition: A | Discharge status: Home / Self Care | Payer: Medicaid Other | Attending: Emergency Medicine | Admitting: Emergency Medicine

## 2014-09-10 ENCOUNTER — Encounter (HOSPITAL_BASED_OUTPATIENT_CLINIC_OR_DEPARTMENT_OTHER): Payer: Self-pay | Admitting: Emergency Medicine

## 2014-09-10 DIAGNOSIS — N939 Abnormal uterine and vaginal bleeding, unspecified: Secondary | ICD-10-CM | POA: Diagnosis not present

## 2014-09-10 DIAGNOSIS — I10 Essential (primary) hypertension: Secondary | ICD-10-CM | POA: Diagnosis not present

## 2014-09-10 DIAGNOSIS — G43909 Migraine, unspecified, not intractable, without status migrainosus: Secondary | ICD-10-CM | POA: Diagnosis not present

## 2014-09-10 DIAGNOSIS — Z3202 Encounter for pregnancy test, result negative: Secondary | ICD-10-CM | POA: Insufficient documentation

## 2014-09-10 DIAGNOSIS — N831 Corpus luteum cyst of ovary, unspecified side: Secondary | ICD-10-CM

## 2014-09-10 DIAGNOSIS — Z8639 Personal history of other endocrine, nutritional and metabolic disease: Secondary | ICD-10-CM | POA: Diagnosis not present

## 2014-09-10 DIAGNOSIS — Z79899 Other long term (current) drug therapy: Secondary | ICD-10-CM | POA: Insufficient documentation

## 2014-09-10 DIAGNOSIS — Z720 Tobacco use: Secondary | ICD-10-CM | POA: Insufficient documentation

## 2014-09-10 DIAGNOSIS — Z792 Long term (current) use of antibiotics: Secondary | ICD-10-CM | POA: Insufficient documentation

## 2014-09-10 LAB — URINALYSIS, ROUTINE W REFLEX MICROSCOPIC
GLUCOSE, UA: NEGATIVE mg/dL
Ketones, ur: NEGATIVE mg/dL
Nitrite: NEGATIVE
Protein, ur: NEGATIVE mg/dL
SPECIFIC GRAVITY, URINE: 1.027 (ref 1.005–1.030)
Urobilinogen, UA: 1 mg/dL (ref 0.0–1.0)
pH: 6.5 (ref 5.0–8.0)

## 2014-09-10 LAB — URINE MICROSCOPIC-ADD ON

## 2014-09-10 LAB — PREGNANCY, URINE: Preg Test, Ur: NEGATIVE

## 2014-09-10 MED ORDER — IBUPROFEN 800 MG PO TABS
800.0000 mg | ORAL_TABLET | Freq: Once | ORAL | Status: AC
  Administered 2014-09-11: 800 mg via ORAL
  Filled 2014-09-10: qty 1

## 2014-09-10 NOTE — ED Notes (Signed)
Patient reports that she is having intermittent "spotting" with grey blood clots over the last few days.

## 2014-09-10 NOTE — ED Provider Notes (Signed)
CSN: [id number]     Arrival date & time 09/10/14  2218 History   This chart was scribed for Paula Libra, MD by Evon Slack, ED Scribe. This patient was seen in room MH09/MH09 and the patient's care was started at 11:00 PM.     Chief Complaint  Patient presents with  . Vaginal Bleeding   Patient is a 34 y.o. female presenting with vaginal bleeding. The history is provided by the patient. No language interpreter was used.  Vaginal Bleeding  HPI Comments: Jaime Dunlap is a 34 y.o. female who presents to the Emergency Department complaining of intermittent vaginal bleeding onset 4 days prior. Pt states that the initial 2 days she only had slight spotting and has now progressed to passing clots that she notices after wiping. Pt reports associated "severe" abdominal cramping that began 1 day ago. This morning she noticed passing a grey-red clot. She states she has a Hx of irregular menstrual cycles. She states her bleeding is currently less than that of a period.   Past Medical History  Diagnosis Date  . Thyroid disease   . Hypertension   . Migraine    History reviewed. No pertinent past surgical history. History reviewed. No pertinent family history. Social History  Substance Use Topics  . Smoking status: Current Every Day Smoker -- 0.50 packs/day    Types: Cigarettes  . Smokeless tobacco: None  . Alcohol Use: No   OB History    No data available     Review of Systems  All other systems reviewed and are negative.   Allergies  Benadryl  Home Medications   Prior to Admission medications   Medication Sig Start Date End Date Taking? Authorizing Provider  Acetaminophen (TYLENOL PO) Take by mouth.    Historical Provider, MD  buPROPion (ZYBAN) 150 MG 12 hr tablet Take 150 mg by mouth 2 (two) times daily.    Historical Provider, MD  cephALEXin (KEFLEX) 500 MG capsule Take 1 capsule (500 mg total) by mouth 4 (four) times daily. 04/22/14   Elson Areas, PA-C  doxycycline  (VIBRAMYCIN) 100 MG capsule Take 1 capsule (100 mg total) by mouth 2 (two) times daily. One po bid x 14 days 04/23/14   April Palumbo, MD  fluconazole (DIFLUCAN) 150 MG tablet Take 1 tablet (150 mg total) by mouth once. 04/23/14   April Palumbo, MD  HYDROcodone-acetaminophen (NORCO/VICODIN) 5-325 MG per tablet Take 2 tablets by mouth every 4 (four) hours as needed. 06/20/13   Elson Areas, PA-C  metoprolol succinate (TOPROL-XL) 25 MG 24 hr tablet Take 25 mg by mouth daily.    Historical Provider, MD  nitrofurantoin, macrocrystal-monohydrate, (MACROBID) 100 MG capsule Take 1 capsule (100 mg total) by mouth 2 (two) times daily. 02/02/14   Glynn Octave, MD  permethrin (ELIMITE) 5 % cream Apply to affected area once 12/15/11   Roxy Horseman, PA-C  permethrin (ELIMITE) 5 % cream Apply to affected area once 07/09/13   Geoffery Lyons, MD  phenazopyridine (PYRIDIUM) 200 MG tablet Take 1 tablet (200 mg total) by mouth 3 (three) times daily. 02/02/14   Glynn Octave, MD  promethazine (PHENERGAN) 25 MG tablet Take 1 tablet (25 mg total) by mouth every 6 (six) hours as needed for nausea. 01/30/12   Nelva Nay, MD  traMADol (ULTRAM) 50 MG tablet Take 1 tablet (50 mg total) by mouth every 6 (six) hours as needed for pain. 01/30/12   Nelva Nay, MD   BP 152/90 mmHg  Pulse 82  Temp(Src) 98.4 F (36.9 C) (Oral)  Resp 16  Ht 5' (1.524 m)  Wt 134 lb (60.782 kg)  BMI 26.17 kg/m2  SpO2 100%  LMP 09/09/2014   Physical Exam General: Well-developed, well-nourished female in no acute distress; appearance consistent with age of record HENT: normocephalic; atraumatic Eyes: pupils equal, round and reactive to light; extraocular muscles intact Neck: supple Heart: regular rate and rhythm; no murmurs, rubs or gallops Lungs: clear to auscultation bilaterally Abdomen: soft; nondistended; suprapubic tenderness; no masses or hepatosplenomegaly; bowel sounds present GU: Normal external genitalia; vaginal bleeding with  clots; cervical motion tenderness; right adnexal tenderness Extremities: No deformity; full range of motion; pulses normal Neurologic: Awake, alert and oriented; motor function intact in all extremities and symmetric; no facial droop Skin: Warm and dry Psychiatric: Normal mood and affect  ED Course  Procedures (including critical care time) DIAGNOSTIC STUDIES: Oxygen Saturation is 100% on RA, normal by my interpretation.    COORDINATION OF CARE: 11:05 PM-Discussed treatment plan with pt at bedside and pt agreed to plan.      MDM   Nursing notes and vitals signs, including pulse oximetry, reviewed.  Summary of this visit's results, reviewed by myself:  Labs:  Results for orders placed or performed during the hospital encounter of 09/10/14 (from the past 24 hour(s))  Urinalysis, Routine w reflex microscopic (not at [facility])     Status: Abnormal   Collection Time: 09/10/14 10:40 PM  Result Value Ref Range   Color, Urine YELLOW YELLOW   APPearance CLEAR CLEAR   Specific Gravity, Urine 1.027 1.005 - 1.030   pH 6.5 5.0 - 8.0   Glucose, UA NEGATIVE NEGATIVE mg/dL   Hgb urine dipstick LARGE (A) NEGATIVE   Bilirubin Urine SMALL (A) NEGATIVE   Ketones, ur NEGATIVE NEGATIVE mg/dL   Protein, ur NEGATIVE NEGATIVE mg/dL   Urobilinogen, UA 1.0 0.0 - 1.0 mg/dL   Nitrite NEGATIVE NEGATIVE   Leukocytes, UA SMALL (A) NEGATIVE  Pregnancy, urine     Status: None   Collection Time: 09/10/14 10:40 PM  Result Value Ref Range   Preg Test, Ur NEGATIVE NEGATIVE  Urine microscopic-add on     Status: None   Collection Time: 09/10/14 10:40 PM  Result Value Ref Range   Squamous Epithelial / LPF RARE RARE   WBC, UA 3-6 <3 WBC/hpf   RBC / HPF 11-20 <3 RBC/hpf   Bacteria, UA RARE RARE    Imaging Studies: US Transvaginal Non-ob  09/24/2014   CLINICAL DATA:  Pelvic pain and vaginal bleeding. Midline pelvic pain and cramping. Heavy vaginal bleeding for days.  EXAM: TRANSABDOMINAL AND TRANSVAGINAL  ULTRASOUND OF PELVIS  TECHNIQUE: Both transabdominal and transvaginal ultrasound examinations of the pelvis were performed. Transabdominal technique was performed for global imaging of the pelvis including uterus, ovaries, adnexal regions, and pelvic cul-de-sac. It was necessary to proceed with endovaginal exam following the transabdominal exam to visualize the uterus, endometrium and ovaries.  COMPARISON:  None  FINDINGS: Uterus  Measurements: 9.5 x 4.6 x 5.6 cm. Myometrial echotexture is heterogeneous. No discrete fibroids or other mass visualized.  Endometrium  Thickness: 1.4 cm. Complex fluid in the endometrial canal. No focal abnormality visualized.  Right ovary  Measurements: 3.4 x 1.6 x 1.7 cm. There is a probable collapsing corpus luteal cyst. Normal blood flow seen. No suspicious mass.  Left ovary  Measurements: 3.6 x 1.4 x 1.5 cm. Normal appearance/no adnexal mass. Normal blood flow seen.  Other findings  Trace free fluid.  IMPRESSION: 1. Complex fluid in the endometrial canal which is normal in thickness. No focal endometrial abnormality. 2. Heterogeneous echogenicity of the myometrium without focal fibroid. 3. Normal appearance of both ovaries. Probable collapsing corpus luteal cyst on the right.   Electronically Signed   By: Rubye Oaks M.D.   On: 09/11/2014 00:42   US Pelvis Complete  09/11/2014   CLINICAL DATA:  Pelvic pain and vaginal bleeding. Midline pelvic pain and cramping. Heavy vaginal bleeding for days.  EXAM: TRANSABDOMINAL AND TRANSVAGINAL ULTRASOUND OF PELVIS  TECHNIQUE: Both transabdominal and transvaginal ultrasound examinations of the pelvis were performed. Transabdominal technique was performed for global imaging of the pelvis including uterus, ovaries, adnexal regions, and pelvic cul-de-sac. It was necessary to proceed with endovaginal exam following the transabdominal exam to visualize the uterus, endometrium and ovaries.  COMPARISON:  None  FINDINGS: Uterus  Measurements: 9.5  x 4.6 x 5.6 cm. Myometrial echotexture is heterogeneous. No discrete fibroids or other mass visualized.  Endometrium  Thickness: 1.4 cm. Complex fluid in the endometrial canal. No focal abnormality visualized.  Right ovary  Measurements: 3.4 x 1.6 x 1.7 cm. There is a probable collapsing corpus luteal cyst. Normal blood flow seen. No suspicious mass.  Left ovary  Measurements: 3.6 x 1.4 x 1.5 cm. Normal appearance/no adnexal mass. Normal blood flow seen.  Other findings  Trace free fluid.  IMPRESSION: 1. Complex fluid in the endometrial canal which is normal in thickness. No focal endometrial abnormality. 2. Heterogeneous echogenicity of the myometrium without focal fibroid. 3. Normal appearance of both ovaries. Probable collapsing corpus luteal cyst on the right.   Electronically Signed   By: Rubye Oaks M.D.   On: 09/11/2014 00:42    I personally performed the services described in this documentation, which was scribed in my presence. The recorded information has been reviewed and is accurate.     Paula Libra, MD 09/11/14 [phone]

## 2014-09-11 MED ORDER — HYDROCODONE-ACETAMINOPHEN 5-325 MG PO TABS: 2.0000 | ORAL_TABLET | Freq: Four times a day (QID) | ORAL | Status: DC | PRN

## 2014-09-12 LAB — HIV ANTIBODY (ROUTINE TESTING W REFLEX): HIV SCREEN 4TH GENERATION: NONREACTIVE

## 2014-09-12 LAB — RPR: RPR: NONREACTIVE

## 2014-09-13 LAB — GC/CHLAMYDIA PROBE AMP (~~LOC~~) NOT AT ARMC
Chlamydia: NEGATIVE
Neisseria Gonorrhea: NEGATIVE

## 2014-11-09 ENCOUNTER — Emergency Department (HOSPITAL_BASED_OUTPATIENT_CLINIC_OR_DEPARTMENT_OTHER)
Admission: EM | Admit: 2014-11-09 | Discharge: 2014-11-09 | Disposition: A | Discharge status: Home / Self Care | Payer: Medicaid Other | Attending: Emergency Medicine | Admitting: Emergency Medicine

## 2014-11-09 ENCOUNTER — Encounter (HOSPITAL_BASED_OUTPATIENT_CLINIC_OR_DEPARTMENT_OTHER): Payer: Self-pay | Admitting: *Deleted

## 2014-11-09 DIAGNOSIS — R300 Dysuria: Secondary | ICD-10-CM | POA: Diagnosis present

## 2014-11-09 DIAGNOSIS — Z720 Tobacco use: Secondary | ICD-10-CM | POA: Insufficient documentation

## 2014-11-09 DIAGNOSIS — Z3202 Encounter for pregnancy test, result negative: Secondary | ICD-10-CM | POA: Insufficient documentation

## 2014-11-09 DIAGNOSIS — Z79899 Other long term (current) drug therapy: Secondary | ICD-10-CM | POA: Insufficient documentation

## 2014-11-09 DIAGNOSIS — I10 Essential (primary) hypertension: Secondary | ICD-10-CM | POA: Diagnosis not present

## 2014-11-09 DIAGNOSIS — Z792 Long term (current) use of antibiotics: Secondary | ICD-10-CM | POA: Insufficient documentation

## 2014-11-09 DIAGNOSIS — N390 Urinary tract infection, site not specified: Secondary | ICD-10-CM | POA: Diagnosis not present

## 2014-11-09 DIAGNOSIS — B9689 Other specified bacterial agents as the cause of diseases classified elsewhere: Secondary | ICD-10-CM

## 2014-11-09 DIAGNOSIS — N760 Acute vaginitis: Secondary | ICD-10-CM | POA: Insufficient documentation

## 2014-11-09 DIAGNOSIS — Z8639 Personal history of other endocrine, nutritional and metabolic disease: Secondary | ICD-10-CM | POA: Insufficient documentation

## 2014-11-09 LAB — URINALYSIS, ROUTINE W REFLEX MICROSCOPIC
BILIRUBIN URINE: NEGATIVE
GLUCOSE, UA: NEGATIVE mg/dL
HGB URINE DIPSTICK: NEGATIVE
Ketones, ur: NEGATIVE mg/dL
Nitrite: NEGATIVE
Protein, ur: NEGATIVE mg/dL
SPECIFIC GRAVITY, URINE: 1.022 (ref 1.005–1.030)
UROBILINOGEN UA: 1 mg/dL (ref 0.0–1.0)
pH: 7.5 (ref 5.0–8.0)

## 2014-11-09 LAB — WET PREP, GENITAL
Trich, Wet Prep: NONE SEEN
Yeast Wet Prep HPF POC: NONE SEEN

## 2014-11-09 LAB — URINE MICROSCOPIC-ADD ON

## 2014-11-09 LAB — PREGNANCY, URINE: PREG TEST UR: NEGATIVE

## 2014-11-09 MED ORDER — METRONIDAZOLE 500 MG PO TABS: 500.0000 mg | ORAL_TABLET | Freq: Two times a day (BID) | ORAL | Status: DC

## 2014-11-09 MED ORDER — CEPHALEXIN 500 MG PO CAPS: 500.0000 mg | ORAL_CAPSULE | Freq: Three times a day (TID) | ORAL | Status: DC

## 2014-11-09 NOTE — Discharge Instructions (Signed)
Bacterial Vaginosis °Bacterial vaginosis is a vaginal infection that occurs when the normal balance of bacteria in the vagina is disrupted. It results from an overgrowth of certain bacteria. This is the most common vaginal infection in women of childbearing age. Treatment is important to prevent complications, especially in pregnant women, as it can cause a premature delivery. °CAUSES  °Bacterial vaginosis is caused by an increase in harmful bacteria that are normally present in smaller amounts in the vagina. Several different kinds of bacteria can cause bacterial vaginosis. However, the reason that the condition develops is not fully understood. °RISK FACTORS °Certain activities or behaviors can put you at an increased risk of developing bacterial vaginosis, including: °· Having a new sex partner or multiple sex partners. °· Douching. °· Using an intrauterine device (IUD) for contraception. °Women do not get bacterial vaginosis from toilet seats, bedding, swimming pools, or contact with objects around them. °SIGNS AND SYMPTOMS  °Some women with bacterial vaginosis have no signs or symptoms. Common symptoms include: °· Grey vaginal discharge. °· A fishlike odor with discharge, especially after sexual intercourse. °· Itching or burning of the vagina and vulva. °· Burning or pain with urination. °DIAGNOSIS  °Your health care provider will take a medical history and examine the vagina for signs of bacterial vaginosis. A sample of vaginal fluid may be taken. Your health care provider will look at this sample under a microscope to check for bacteria and abnormal cells. A vaginal pH test may also be done.  °TREATMENT  °Bacterial vaginosis may be treated with antibiotic medicines. These may be given in the form of a pill or a vaginal cream. A second round of antibiotics may be prescribed if the condition comes back after treatment. Because bacterial vaginosis increases your risk for sexually transmitted diseases, getting  treated can help reduce your risk for chlamydia, gonorrhea, HIV, and herpes. °HOME CARE INSTRUCTIONS  °· Only take over-the-counter or prescription medicines as directed by your health care provider. °· If antibiotic medicine was prescribed, take it as directed. Make sure you finish it even if you start to feel better. °· Tell all sexual partners that you have a vaginal infection. They should see their health care provider and be treated if they have problems, such as a mild rash or itching. °· During treatment, it is important that you follow these instructions: °· Avoid sexual activity or use condoms correctly. °· Do not douche. °· Avoid alcohol as directed by your health care provider. °· Avoid breastfeeding as directed by your health care provider. °SEEK MEDICAL CARE IF:  °· Your symptoms are not improving after 3 days of treatment. °· You have increased discharge or pain. °· You have a fever. °MAKE SURE YOU:  °· Understand these instructions. °· Will watch your condition. °· Will get help right away if you are not doing well or get worse. °FOR MORE INFORMATION  °Centers for Disease Control and Prevention, Division of STD Prevention: www.cdc.gov/std °American Sexual Health Association (ASHA): www.ashastd.org  °  °This information is not intended to replace advice given to you by your health care provider. Make sure you discuss any questions you have with your health care provider. °  °Document Released: 01/01/2005 Document Revised: 01/22/2014 Document Reviewed: 08/13/2012 °Elsevier Interactive Patient Education ©2016 [employer] ° °Urinary Tract Infection °Urinary tract infections (UTIs) can develop anywhere along your urinary tract. Your urinary tract is your body's drainage system for removing wastes and extra water. Your urinary tract includes two kidneys, two ureters,   a bladder, and a urethra. Your kidneys are a pair of bean-shaped organs. Each kidney is about the size of your fist. They are located below  your ribs, one on each side of your spine. °CAUSES °Infections are caused by microbes, which are microscopic organisms, including fungi, viruses, and bacteria. These organisms are so small that they can only be seen through a microscope. Bacteria are the microbes that most commonly cause UTIs. °SYMPTOMS  °Symptoms of UTIs may vary by age and gender of the patient and by the location of the infection. Symptoms in young women typically include a frequent and intense urge to urinate and a painful, burning feeling in the bladder or urethra during urination. Older women and men are more likely to be tired, shaky, and weak and have muscle aches and abdominal pain. A fever may mean the infection is in your kidneys. Other symptoms of a kidney infection include pain in your back or sides below the ribs, nausea, and vomiting. °DIAGNOSIS °To diagnose a UTI, your caregiver will ask you about your symptoms. Your caregiver will also ask you to provide a urine sample. The urine sample will be tested for bacteria and white blood cells. White blood cells are made by your body to help fight infection. °TREATMENT  °Typically, UTIs can be treated with medication. Because most UTIs are caused by a bacterial infection, they usually can be treated with the use of antibiotics. The choice of antibiotic and length of treatment depend on your symptoms and the type of bacteria causing your infection. °HOME CARE INSTRUCTIONS °· If you were prescribed antibiotics, take them exactly as your caregiver instructs you. Finish the medication even if you feel better after you have only taken some of the medication. °· Drink enough water and fluids to keep your urine clear or pale yellow. °· Avoid caffeine, tea, and carbonated beverages. They tend to irritate your bladder. °· Empty your bladder often. Avoid holding urine for long periods of time. °· Empty your bladder before and after sexual intercourse. °· After a bowel movement, women should cleanse  from front to back. Use each tissue only once. °SEEK MEDICAL CARE IF:  °· You have back pain. °· You develop a fever. °· Your symptoms do not begin to resolve within 3 days. °SEEK IMMEDIATE MEDICAL CARE IF:  °· You have severe back pain or lower abdominal pain. °· You develop chills. °· You have nausea or vomiting. °· You have continued burning or discomfort with urination. °MAKE SURE YOU:  °· Understand these instructions. °· Will watch your condition. °· Will get help right away if you are not doing well or get worse. °  °This information is not intended to replace advice given to you by your health care provider. Make sure you discuss any questions you have with your health care provider. °  °Document Released: 10/11/2004 Document Revised: 09/22/2014 Document Reviewed: 02/09/2011 °Elsevier Interactive Patient Education ©2016 Elsevier Inc. ° °

## 2014-11-09 NOTE — ED Notes (Signed)
Dysuria and cloudy urine x 4 days.

## 2014-11-09 NOTE — ED Provider Notes (Signed)
CSN: [id number]     Arrival date & time 11/09/14  1345 History   First MD Initiated Contact with Patient 11/09/14 1351     Chief Complaint  Patient presents with  . Urinary Tract Infection     (Consider location/radiation/quality/duration/timing/severity/associated sxs/prior Treatment) HPI Comments: Patient has noticed a fishy odor since taking an antibiotic about one month ago for a UTI.  Patient is a 34 y.o. female presenting with urinary tract infection. The history is provided by the patient.  Urinary Tract Infection Pain quality:  Burning Pain severity:  Moderate Onset quality:  Gradual Duration:  4 days Timing:  Constant Progression:  Unchanged Chronicity:  New Associated symptoms: no fever     Past Medical History  Diagnosis Date  . Thyroid disease   . Hypertension   . Migraine    History reviewed. No pertinent past surgical history. No family history on file. Social History  Substance Use Topics  . Smoking status: Current Every Day Smoker -- 0.50 packs/day    Types: Cigarettes  . Smokeless tobacco: None  . Alcohol Use: No   OB History    No data available     Review of Systems  Constitutional: Negative for fever.  Respiratory: Negative for cough and shortness of breath.   All other systems reviewed and are negative.     Allergies  Benadryl  Home Medications   Prior to Admission medications   Medication Sig Start Date End Date Taking? Authorizing Provider  cephALEXin (KEFLEX) 500 MG capsule Take 1 capsule (500 mg total) by mouth 4 (four) times daily. 04/22/14   Elson AreasLeslie K Sofia, PA-C  HYDROcodone-acetaminophen (NORCO/VICODIN) 5-325 MG per tablet Take 2 tablets by mouth every 6 (six) hours as needed (for pain). 09/11/14   John Molpus, MD  metoprolol succinate (TOPROL-XL) 25 MG 24 hr tablet Take 25 mg by mouth daily.    Historical Provider, MD   BP 133/94 mmHg  Pulse 97  Temp(Src) 98.4 F (36.9 C) (Oral)  Resp 18  Ht 4\' 10"  (1.473 m)  Wt 121 lb  (54.885 kg)  BMI 25.30 kg/m2  SpO2 100%  LMP 10/10/2014 Physical Exam  Constitutional: She is oriented to person, place, and time. She appears well-developed and well-nourished. No distress.  HENT:  Head: Normocephalic and atraumatic.  Mouth/Throat: Oropharynx is clear and moist.  Eyes: EOM are normal. Pupils are equal, round, and reactive to light.  Neck: Normal range of motion. Neck supple.  Cardiovascular: Normal rate and regular rhythm.  Exam reveals no friction rub.   No murmur heard. Pulmonary/Chest: Effort normal and breath sounds normal. No respiratory distress. She has no wheezes. She has no rales.  Abdominal: Soft. She exhibits no distension. There is no tenderness. There is no rebound.  Musculoskeletal: Normal range of motion. She exhibits no edema.  Neurological: She is alert and oriented to person, place, and time.  Skin: No rash noted. She is not diaphoretic.  Nursing note and vitals reviewed.   ED Course  Procedures (including critical care time) Labs Review Labs Reviewed  WET PREP, GENITAL - Abnormal; Notable for the following:    Clue Cells Wet Prep HPF POC MODERATE (*)    WBC, Wet Prep HPF POC MODERATE (*)    All other components within normal limits  URINALYSIS, ROUTINE W REFLEX MICROSCOPIC (NOT AT [facility]) - Abnormal; Notable for the following:    APPearance CLOUDY (*)    Leukocytes, UA SMALL (*)    All other components within normal limits  URINE MICROSCOPIC-ADD ON - Abnormal; Notable for the following:    Bacteria, UA FEW (*)    All other components within normal limits  PREGNANCY, URINE  GC/CHLAMYDIA PROBE AMP ([place]) NOT AT [facility]    Imaging Review No results found. I have personally reviewed and evaluated these images and lab results as part of my medical decision-making.   EKG Interpretation None      MDM   Final diagnoses:  UTI (lower urinary tract infection)  Bacterial vaginosis    34F here with dysuria and a fishy odor. Denies  concern for STDs. Recent UTI where she was given keflex. No fever, vomiting, diarrhea, vaginal bleeding. AFVSS here. Mild anterior vaginal wall tenderness, c/w a cystitis. Small amount of white discharge. UTI on UA, BV on wet prep.  Treated with flagyl and keflex. Stable for discharge.  Elwin Mocha, MD 11/09/14 [phone]

## 2014-11-10 LAB — GC/CHLAMYDIA PROBE AMP (~~LOC~~) NOT AT ARMC
Chlamydia: NEGATIVE
Neisseria Gonorrhea: NEGATIVE

## 2014-11-12 ENCOUNTER — Emergency Department (HOSPITAL_BASED_OUTPATIENT_CLINIC_OR_DEPARTMENT_OTHER)
Admission: EM | Admit: 2014-11-12 | Discharge: 2014-11-12 | Disposition: A | Discharge status: Home / Self Care | Payer: Medicaid Other | Attending: Emergency Medicine | Admitting: Emergency Medicine

## 2014-11-12 ENCOUNTER — Encounter (HOSPITAL_BASED_OUTPATIENT_CLINIC_OR_DEPARTMENT_OTHER): Payer: Self-pay

## 2014-11-12 DIAGNOSIS — Z720 Tobacco use: Secondary | ICD-10-CM | POA: Diagnosis not present

## 2014-11-12 DIAGNOSIS — Z792 Long term (current) use of antibiotics: Secondary | ICD-10-CM | POA: Insufficient documentation

## 2014-11-12 DIAGNOSIS — Z79899 Other long term (current) drug therapy: Secondary | ICD-10-CM | POA: Diagnosis not present

## 2014-11-12 DIAGNOSIS — N390 Urinary tract infection, site not specified: Secondary | ICD-10-CM | POA: Diagnosis not present

## 2014-11-12 DIAGNOSIS — Z8639 Personal history of other endocrine, nutritional and metabolic disease: Secondary | ICD-10-CM | POA: Diagnosis not present

## 2014-11-12 DIAGNOSIS — G43909 Migraine, unspecified, not intractable, without status migrainosus: Secondary | ICD-10-CM | POA: Insufficient documentation

## 2014-11-12 DIAGNOSIS — I10 Essential (primary) hypertension: Secondary | ICD-10-CM | POA: Insufficient documentation

## 2014-11-12 DIAGNOSIS — B373 Candidiasis of vulva and vagina: Secondary | ICD-10-CM | POA: Diagnosis not present

## 2014-11-12 DIAGNOSIS — L293 Anogenital pruritus, unspecified: Secondary | ICD-10-CM | POA: Diagnosis present

## 2014-11-12 DIAGNOSIS — B3731 Acute candidiasis of vulva and vagina: Secondary | ICD-10-CM

## 2014-11-12 LAB — URINALYSIS, ROUTINE W REFLEX MICROSCOPIC
GLUCOSE, UA: NEGATIVE mg/dL
HGB URINE DIPSTICK: NEGATIVE
KETONES UR: 15 mg/dL — AB
NITRITE: POSITIVE — AB
PH: 7 (ref 5.0–8.0)
Protein, ur: NEGATIVE mg/dL
SPECIFIC GRAVITY, URINE: 1.02 (ref 1.005–1.030)
Urobilinogen, UA: 1 mg/dL (ref 0.0–1.0)

## 2014-11-12 LAB — URINE MICROSCOPIC-ADD ON

## 2014-11-12 LAB — WET PREP, GENITAL: TRICH WET PREP: NONE SEEN

## 2014-11-12 MED ORDER — FLUCONAZOLE 50 MG PO TABS
150.0000 mg | ORAL_TABLET | Freq: Once | ORAL | Status: AC
  Administered 2014-11-12: 150 mg via ORAL
  Filled 2014-11-12 (×2): qty 1

## 2014-11-12 NOTE — ED Notes (Signed)
Pt verbalizes understanding of d/c instructions and denies any further needs at this time. 

## 2014-11-12 NOTE — ED Provider Notes (Signed)
CSN: [id number]     Arrival date & time 11/12/14  1920 History   First MD Initiated Contact with Patient 11/12/14 1929     Chief Complaint  Patient presents with  . Vaginal Itching     (Consider location/radiation/quality/duration/timing/severity/associated sxs/prior Treatment) HPI Jaime Dunlap is a 34 y.o. female with PMH significant for thyroid disease, HTN, migraine who presents with vaginal irritation, burning, and swelling that began this morning.  Patient was seen here 11/09/14 and diagnosed with UTI and BV, prescribed keflex and flagyl.  Patient states she has been taking these as prescribed.  She woke up this morning with vaginal swelling and burning.  Mildly uncomfortable, constant.  Nothing makes it better and nothing makes it worse.  Endorses nausea (but states that's because she doesn't always eat before taking the antibiotic) and dark urine.  Denies vaginal discharge, odor, dysuria, abdominal pain, vomiting, CP, or SOB.  Patient is taking pyridium.   Past Medical History  Diagnosis Date  . Thyroid disease   . Hypertension   . Migraine    History reviewed. No pertinent past surgical history. No family history on file. Social History  Substance Use Topics  . Smoking status: Current Every Day Smoker -- 0.50 packs/day    Types: Cigarettes  . Smokeless tobacco: None  . Alcohol Use: No   OB History    No data available     Review of Systems All other systems negative unless otherwise stated in HPI    Allergies  Benadryl  Home Medications   Prior to Admission medications   Medication Sig Start Date End Date Taking? Authorizing Provider  cephALEXin (KEFLEX) 500 MG capsule Take 1 capsule (500 mg total) by mouth 4 (four) times daily. 04/22/14   Elson AreasLeslie K Sofia, PA-C  cephALEXin (KEFLEX) 500 MG capsule Take 1 capsule (500 mg total) by mouth 3 (three) times daily. 11/09/14   Elwin MochaBlair Walden, MD  HYDROcodone-acetaminophen (NORCO/VICODIN) 5-325 MG per tablet Take 2 tablets  by mouth every 6 (six) hours as needed (for pain). 09/11/14   John Molpus, MD  metoprolol succinate (TOPROL-XL) 25 MG 24 hr tablet Take 25 mg by mouth daily.    Historical Provider, MD  metroNIDAZOLE (FLAGYL) 500 MG tablet Take 1 tablet (500 mg total) by mouth 2 (two) times daily. One po bid x 7 days 11/09/14   Elwin MochaBlair Walden, MD   BP 136/97 mmHg  Pulse 78  Temp(Src) 98.7 F (37.1 C)  Resp 18  Ht 4\' 10"  (1.473 m)  Wt 121 lb (54.885 kg)  BMI 25.30 kg/m2  SpO2 100%  LMP 10/18/2014 (Exact Date) Physical Exam  Constitutional: She is oriented to person, place, and time. She appears well-developed and well-nourished.  HENT:  Head: Normocephalic and atraumatic.  Mouth/Throat: Oropharynx is clear and moist.  Eyes: Conjunctivae are normal.  Neck: Normal range of motion. Neck supple.  Cardiovascular: Normal rate, regular rhythm and normal heart sounds.   No murmur heard. Pulmonary/Chest: Effort normal and breath sounds normal. No accessory muscle usage or stridor. No respiratory distress. She has no wheezes. She has no rhonchi. She has no rales.  Abdominal: Soft. Bowel sounds are normal. She exhibits no distension. There is no tenderness.  Genitourinary: Uterus normal.    There is no tenderness or lesion on the right labia. There is tenderness on the left labia. There is no rash or lesion on the left labia. Cervix exhibits discharge. Cervix exhibits no motion tenderness. Right adnexum displays no tenderness. Left adnexum displays no  tenderness. There is erythema and tenderness in the vagina. No bleeding in the vagina. No signs of injury around the vagina. Vaginal discharge found.  Musculoskeletal: Normal range of motion.  Lymphadenopathy:    She has no cervical adenopathy.  Neurological: She is alert and oriented to person, place, and time.  Speech clear without dysarthria.  Skin: Skin is warm and dry.  Psychiatric: She has a normal mood and affect. Her behavior is normal.    ED Course    Procedures (including critical care time) Labs Review Labs Reviewed  WET PREP, GENITAL - Abnormal; Notable for the following:    Yeast Wet Prep HPF POC MANY (*)    Clue Cells Wet Prep HPF POC MODERATE (*)    WBC, Wet Prep HPF POC TOO NUMEROUS TO COUNT (*)    All other components within normal limits  URINALYSIS, ROUTINE W REFLEX MICROSCOPIC (NOT AT [facility]) - Abnormal; Notable for the following:    Color, Urine GREEN (*)    APPearance CLOUDY (*)    Bilirubin Urine MODERATE (*)    Ketones, ur 15 (*)    Nitrite POSITIVE (*)    Leukocytes, UA MODERATE (*)    All other components within normal limits  URINE MICROSCOPIC-ADD ON - Abnormal; Notable for the following:    Squamous Epithelial / LPF FEW (*)    Bacteria, UA FEW (*)    All other components within normal limits  URINE CULTURE    Imaging Review No results found. I have personally reviewed and evaluated these images and lab results as part of my medical decision-making.   EKG Interpretation None      MDM   Final diagnoses:  Yeast infection of the vagina  UTI (lower urinary tract infection)    Patient presents with vaginal swelling and burning since this AM and dark urine.  Patient taking pyridium.  Seen 11/09/14 and treated for UTI and BV.  Taking keflex and flagyl as prescribed.  VSS, NAD.  On exam, abdomen soft, non-tender.  Heart RRR, lungs CTAB.  GU exam left labia swelling and tenderness.  Moderate vaginal discharge.  Cervical white discharge.  Will obtain wet prep.  Will obtain UA and urine culture.  Suspect yeast infection.  Wet prep shows evidence of yeast infection.  Will give PO diflucan here. UA shows abnormal color and evidence of remaining UTI.  Patient advised to continue keflex and flagyl as prescribed.  Urine culture pending.  Suspect abnormal color secondary to pyridium.  Follow up PCP for moderate bilirubin, she has no abdominal tenderness on exam or abdominal complaints.   Patient stable for discharge.   VSS.  Discussed return precautions.  Patient agrees and acknowledges the above plan for discharge.  Case has been discussed with Dr. Lynelle Doctor who agrees with the above plan for discharge.     Cheri Fowler, PA-C 11/12/14 2111  Linwood Dibbles, MD 11/16/14 [phone]

## 2014-11-12 NOTE — ED Notes (Signed)
Pt was seen two days ago dx'd with UTI and BV, taking antibiotics, woke this morning with vaginal irritation and swelling.  States she tried to call pharmacy to see if OTC yeast infection treatment was appropriate and they told her to come and get checked.

## 2014-11-12 NOTE — Discharge Instructions (Signed)

## 2014-11-14 LAB — URINE CULTURE

## 2015-01-07 ENCOUNTER — Encounter (HOSPITAL_BASED_OUTPATIENT_CLINIC_OR_DEPARTMENT_OTHER): Payer: Self-pay | Admitting: *Deleted

## 2015-01-07 ENCOUNTER — Emergency Department (HOSPITAL_BASED_OUTPATIENT_CLINIC_OR_DEPARTMENT_OTHER)
Admission: EM | Admit: 2015-01-07 | Discharge: 2015-01-07 | Discharge status: Against Medical Advice | Payer: Medicaid Other | Attending: Emergency Medicine | Admitting: Emergency Medicine

## 2015-01-07 DIAGNOSIS — R079 Chest pain, unspecified: Secondary | ICD-10-CM | POA: Diagnosis present

## 2015-01-07 DIAGNOSIS — Z3200 Encounter for pregnancy test, result unknown: Secondary | ICD-10-CM | POA: Insufficient documentation

## 2015-01-07 DIAGNOSIS — R0781 Pleurodynia: Secondary | ICD-10-CM | POA: Diagnosis not present

## 2015-01-07 DIAGNOSIS — I10 Essential (primary) hypertension: Secondary | ICD-10-CM | POA: Diagnosis not present

## 2015-01-07 DIAGNOSIS — F17210 Nicotine dependence, cigarettes, uncomplicated: Secondary | ICD-10-CM | POA: Insufficient documentation

## 2015-01-07 NOTE — ED Notes (Signed)
No answer in ED WR when called x 2

## 2015-01-07 NOTE — ED Notes (Signed)
Nurse first-pt presented to ED as visitor with family approx 15 min-has now decided to be seen-NAD

## 2015-01-07 NOTE — ED Notes (Signed)
Pt c/o bil " rib pain  When i take a deep breath"  Also request preg test

## 2015-01-08 ENCOUNTER — Encounter (HOSPITAL_BASED_OUTPATIENT_CLINIC_OR_DEPARTMENT_OTHER): Payer: Self-pay | Admitting: Emergency Medicine

## 2015-01-08 ENCOUNTER — Emergency Department (HOSPITAL_BASED_OUTPATIENT_CLINIC_OR_DEPARTMENT_OTHER)
Admission: EM | Admit: 2015-01-08 | Discharge: 2015-01-08 | Disposition: A | Discharge status: Home / Self Care | Payer: Medicaid Other | Attending: Emergency Medicine | Admitting: Emergency Medicine

## 2015-01-08 DIAGNOSIS — Z8639 Personal history of other endocrine, nutritional and metabolic disease: Secondary | ICD-10-CM | POA: Diagnosis not present

## 2015-01-08 DIAGNOSIS — O99411 Diseases of the circulatory system complicating pregnancy, first trimester: Secondary | ICD-10-CM | POA: Insufficient documentation

## 2015-01-08 DIAGNOSIS — Z3A01 Less than 8 weeks gestation of pregnancy: Secondary | ICD-10-CM | POA: Diagnosis not present

## 2015-01-08 DIAGNOSIS — O2341 Unspecified infection of urinary tract in pregnancy, first trimester: Secondary | ICD-10-CM | POA: Insufficient documentation

## 2015-01-08 DIAGNOSIS — F17210 Nicotine dependence, cigarettes, uncomplicated: Secondary | ICD-10-CM | POA: Insufficient documentation

## 2015-01-08 DIAGNOSIS — O99331 Smoking (tobacco) complicating pregnancy, first trimester: Secondary | ICD-10-CM | POA: Insufficient documentation

## 2015-01-08 DIAGNOSIS — I10 Essential (primary) hypertension: Secondary | ICD-10-CM | POA: Insufficient documentation

## 2015-01-08 DIAGNOSIS — O9989 Other specified diseases and conditions complicating pregnancy, childbirth and the puerperium: Secondary | ICD-10-CM | POA: Diagnosis present

## 2015-01-08 DIAGNOSIS — Z3201 Encounter for pregnancy test, result positive: Secondary | ICD-10-CM

## 2015-01-08 DIAGNOSIS — N390 Urinary tract infection, site not specified: Secondary | ICD-10-CM

## 2015-01-08 LAB — URINALYSIS, ROUTINE W REFLEX MICROSCOPIC
Glucose, UA: NEGATIVE mg/dL
Hgb urine dipstick: NEGATIVE
KETONES UR: 15 mg/dL — AB
NITRITE: NEGATIVE
PROTEIN: NEGATIVE mg/dL
Specific Gravity, Urine: 1.038 — ABNORMAL HIGH (ref 1.005–1.030)
pH: 5.5 (ref 5.0–8.0)

## 2015-01-08 LAB — CBC WITH DIFFERENTIAL/PLATELET
BASOS ABS: 0 10*3/uL (ref 0.0–0.1)
Basophils Relative: 0 %
EOS ABS: 0.1 10*3/uL (ref 0.0–0.7)
Eosinophils Relative: 1 %
HCT: 37.4 % (ref 36.0–46.0)
Hemoglobin: 11.8 g/dL — ABNORMAL LOW (ref 12.0–15.0)
Lymphocytes Relative: 38 %
Lymphs Abs: 4.2 10*3/uL — ABNORMAL HIGH (ref 0.7–4.0)
MCH: 25.3 pg — ABNORMAL LOW (ref 26.0–34.0)
MCHC: 31.6 g/dL (ref 30.0–36.0)
MCV: 80.3 fL (ref 78.0–100.0)
MONOS PCT: 6 %
Monocytes Absolute: 0.7 10*3/uL (ref 0.1–1.0)
NEUTROS PCT: 55 %
Neutro Abs: 6.2 10*3/uL (ref 1.7–7.7)
PLATELETS: 302 10*3/uL (ref 150–400)
RBC: 4.66 MIL/uL (ref 3.87–5.11)
RDW: 16 % — AB (ref 11.5–15.5)
WBC: 11.1 10*3/uL — ABNORMAL HIGH (ref 4.0–10.5)

## 2015-01-08 LAB — COMPREHENSIVE METABOLIC PANEL
ALT: 9 U/L — AB (ref 14–54)
AST: 12 U/L — AB (ref 15–41)
Albumin: 3.9 g/dL (ref 3.5–5.0)
Alkaline Phosphatase: 76 U/L (ref 38–126)
Anion gap: 7 (ref 5–15)
BUN: 7 mg/dL (ref 6–20)
CHLORIDE: 107 mmol/L (ref 101–111)
CO2: 23 mmol/L (ref 22–32)
CREATININE: 0.74 mg/dL (ref 0.44–1.00)
Calcium: 8.4 mg/dL — ABNORMAL LOW (ref 8.9–10.3)
GFR calc non Af Amer: >60 mL/min (ref >60)
Glucose, Bld: 83 mg/dL (ref 65–99)
Potassium: 3.5 mmol/L (ref 3.5–5.1)
SODIUM: 137 mmol/L (ref 135–145)
Total Bilirubin: 0.5 mg/dL (ref 0.3–1.2)
Total Protein: 7.1 g/dL (ref 6.5–8.1)

## 2015-01-08 LAB — URINE MICROSCOPIC-ADD ON

## 2015-01-08 LAB — PREGNANCY, URINE: PREG TEST UR: POSITIVE — AB

## 2015-01-08 LAB — ABO/RH: ABO/RH(D): A POS

## 2015-01-08 LAB — WET PREP, GENITAL
Sperm: NONE SEEN
Trich, Wet Prep: NONE SEEN

## 2015-01-08 LAB — HCG, QUANTITATIVE, PREGNANCY: HCG, BETA CHAIN, QUANT, S: 531 m[IU]/mL — AB (ref <5)

## 2015-01-08 MED ORDER — CEPHALEXIN 500 MG PO CAPS: 500.0000 mg | ORAL_CAPSULE | Freq: Two times a day (BID) | ORAL | Status: DC

## 2015-01-08 NOTE — ED Provider Notes (Signed)
CSN: [id number]     Arrival date & time 01/08/15  1632 History  By signing my name below, I, Jaime Dunlap, attest that this documentation has been prepared under Jaime direction and in Jaime presence of Tilden Fossa, MD. Electronically Signed: Elon Dunlap, ED Scribe. 01/08/2015. 6:57 PM.    Chief Complaint  Patient presents with  . Abdominal Cramping  . Possible Pregnancy   Jaime history is provided by Jaime patient. No language interpreter was used.   HPI Comments: Jaime Dunlap is a 34 y.o. female HTN, thyroid disease, heart murmur, GERD who presents to Jaime Emergency Department complaining of minimal abdominal cramping onset today.  Jaime patient reports nausea over Jaime past several days which prompted her to take 3 positive pregnancy tests.  LNMP start 11/27.  She denies discolored urine, fever, vomiting, dysuria, vaginal discharge.  Patient is G3P3A0 and has a hx of placenta previa and gestational HTN.  She has not been seen by her OB/GYN Philis Kendall for current pregnancy.  Patient does not know her blood type.  Patient takes no medications regularly.  She reports an allergy to penicillin and Benadryl.  Past Medical History  Diagnosis Date  . Thyroid disease   . Hypertension   . Migraine    History reviewed. No pertinent past surgical history. History reviewed. No pertinent family history. Social History  Substance Use Topics  . Smoking status: Current Every Day Smoker -- 0.50 packs/day    Types: Cigarettes  . Smokeless tobacco: None  . Alcohol Use: No   OB History    No data available     Review of Systems  All other systems reviewed and are negative.  A complete 10 system review of systems was obtained and all systems are negative except as noted in Jaime HPI and PMH.   Allergies  Benadryl  Home Medications   Prior to Admission medications   Medication Sig Start Date End Date Taking? Authorizing Provider  cephALEXin (KEFLEX) 500 MG capsule Take 1 capsule (500 mg total) by  mouth 2 (two) times daily. 01/08/15   Tilden Fossa, MD   BP 132/89 mmHg  Pulse 94  Temp(Src) 98.5 F (36.9 C) (Oral)  Resp 16  Ht  (1.473 Jaime)  Wt 116 lb (52.617 kg)  BMI 24.25 kg/m2  SpO2 100%  LMP 12/12/2014 Physical Exam  Constitutional: She is oriented to person, place, and time. She appears well-developed and well-nourished.  HENT:  Head: Normocephalic and atraumatic.  Cardiovascular: Normal rate and regular rhythm.   No murmur heard. Pulmonary/Chest: Effort normal and breath sounds normal. No respiratory distress.  Abdominal: Soft. There is no tenderness. There is no rebound and no guarding.  Genitourinary:  Scant vaginal discharge, no vaginal bleeding, os closed, no cmt  Musculoskeletal: She exhibits no edema or tenderness.  Neurological: She is alert and oriented to person, place, and time.  Skin: Skin is warm and dry.  Psychiatric: She has a normal mood and affect. Her behavior is normal.  Nursing Dunlap and vitals reviewed.   ED Course  Procedures (including critical care time)  DIAGNOSTIC STUDIES: Oxygen Saturation is 100% on RA, normal by my interpretation.    COORDINATION OF CARE:  6:53 PM Discussed treatment plan with patient at bedside.  Patient acknowledges and agrees with plan.    Labs Review Labs Reviewed  WET PREP, GENITAL - Abnormal; Notable for Jaime following:    Yeast Wet Prep HPF POC PRESENT (*)    Clue Cells Wet Prep HPF  POC PRESENT (*)    WBC, Wet Prep HPF POC MANY (*)    All other components within normal limits  URINALYSIS, ROUTINE W REFLEX MICROSCOPIC (NOT [facility]) - Abnormal; Notable for Jaime following:    Color, Urine AMBER (*)    APPearance CLOUDY (*)    Specific Gravity, Urine 1.038 (*)    Bilirubin Urine SMALL (*)    Ketones, ur 15 (*)    Leukocytes, UA SMALL (*)    All other components within normal limits  PREGNANCY, URINE - Abnormal; Notable for Jaime following:    Preg Test, Ur POSITIVE (*)    All other components within  normal limits  URINE MICROSCOPIC-ADD ON - Abnormal; Notable for Jaime following:    Squamous Epithelial / LPF 6-30 (*)    Bacteria, UA MANY (*)    All other components within normal limits  HCG, QUANTITATIVE, PREGNANCY - Abnormal; Notable for Jaime following:    hCG, Beta Chain, Quant, S 531 (*)    All other components within normal limits  COMPREHENSIVE METABOLIC PANEL - Abnormal; Notable for Jaime following:    Calcium 8.4 (*)    AST 12 (*)    ALT 9 (*)    All other components within normal limits  CBC WITH DIFFERENTIAL/PLATELET - Abnormal; Notable for Jaime following:    WBC 11.1 (*)    Hemoglobin 11.8 (*)    MCH 25.3 (*)    RDW 16.0 (*)    Lymphs Abs 4.2 (*)    All other components within normal limits  HIV ANTIBODY (ROUTINE TESTING)  ABO/RH  GC/CHLAMYDIA PROBE AMP ([place]) NOT [facility]    Imaging Review No results found. I have personally reviewed and evaluated these images and lab results as part of my medical decision-making.   EKG Interpretation None      MDM   Final diagnoses:  Positive pregnancy test  Acute UTI   Patient here for evaluation of mild abdominal cramping, positive home pregnancy test. She is in no distress in Jaime emergency department. No active bleeding on examination. Quantitative hCG is 531. Formal ultrasound testing is not available emergency department at this time. There is a low clinical suspicion Jaime patient has ectopic pregnancy given her symptoms and presentation. She does not want to go to another emergency department for formal ultrasound testing. Discussed very close return precautions and Jaime importance of repeat Quant testing in Jaime next 2 days to evaluate for rise in her Quant. Recommend that she presents directly to an emergency department for further evaluation if she develops recurrent pain, bleeding, new concerning symptoms. UA concerning for possible urinary tract infection, we'll treat with Keflex. Wet prep with yeast and clue cells,  patient is asymptomatic, will not treat at this time.  I personally performed Jaime services described in this documentation, which was scribed in my presence. Jaime recorded information has been reviewed and is accurate.      Tilden FossaElizabeth Astella Desir, MD 01/09/15 [id number]

## 2015-01-08 NOTE — Discharge Instructions (Signed)
Your pregnancy hormone was 531 today.  You will need to have your pregnancy hormone rechecked in the next 48 hours to make sure it is rising appropriately.  If you develop severe abdominal pain, heavy bleeding, or new concerning symptoms you need to be rechecked immediately.    Urinary Tract Infection Urinary tract infections (UTIs) can develop anywhere along your urinary tract. Your urinary tract is your body's drainage system for removing wastes and extra water. Your urinary tract includes two kidneys, two ureters, a bladder, and a urethra. Your kidneys are a pair of bean-shaped organs. Each kidney is about the size of your fist. They are located below your ribs, one on each side of your spine. CAUSES Infections are caused by microbes, which are microscopic organisms, including fungi, viruses, and bacteria. These organisms are so small that they can only be seen through a microscope. Bacteria are the microbes that most commonly cause UTIs. SYMPTOMS  Symptoms of UTIs may vary by age and gender of the patient and by the location of the infection. Symptoms in young women typically include a frequent and intense urge to urinate and a painful, burning feeling in the bladder or urethra during urination. Older women and men are more likely to be tired, shaky, and weak and have muscle aches and abdominal pain. A fever may mean the infection is in your kidneys. Other symptoms of a kidney infection include pain in your back or sides below the ribs, nausea, and vomiting. DIAGNOSIS To diagnose a UTI, your caregiver will ask you about your symptoms. Your caregiver will also ask you to provide a urine sample. The urine sample will be tested for bacteria and white blood cells. White blood cells are made by your body to help fight infection. TREATMENT  Typically, UTIs can be treated with medication. Because most UTIs are caused by a bacterial infection, they usually can be treated with the use of antibiotics. The  choice of antibiotic and length of treatment depend on your symptoms and the type of bacteria causing your infection. HOME CARE INSTRUCTIONS  If you were prescribed antibiotics, take them exactly as your caregiver instructs you. Finish the medication even if you feel better after you have only taken some of the medication.  Drink enough water and fluids to keep your urine clear or pale yellow.  Avoid caffeine, tea, and carbonated beverages. They tend to irritate your bladder.  Empty your bladder often. Avoid holding urine for long periods of time.  Empty your bladder before and after sexual intercourse.  After a bowel movement, women should cleanse from front to back. Use each tissue only once. SEEK MEDICAL CARE IF:   You have back pain.  You develop a fever.  Your symptoms do not begin to resolve within 3 days. SEEK IMMEDIATE MEDICAL CARE IF:   You have severe back pain or lower abdominal pain.  You develop chills.  You have nausea or vomiting.  You have continued burning or discomfort with urination. MAKE SURE YOU:   Understand these instructions.  Will watch your condition.  Will get help right away if you are not doing well or get worse.   This information is not intended to replace advice given to you by your health care provider. Make sure you discuss any questions you have with your health care provider.   Document Released: 10/11/2004 Document Revised: 09/22/2014 Document Reviewed: 02/09/2011 Elsevier Interactive Patient Education [employer] First Trimester of Pregnancy The first trimester of pregnancy is from  week 1 until the end of week 12 (months 1 through 3). A week after a sperm fertilizes an egg, the egg will implant on the wall of the uterus. This embryo will begin to develop into a baby. Genes from you and your partner are forming the baby. The female genes determine whether the baby is a boy or a girl. At 6-8 weeks, the eyes and face are formed, and  the heartbeat can be seen on ultrasound. At the end of 12 weeks, all the baby's organs are formed.  Now that you are pregnant, you will want to do everything you can to have a healthy baby. Two of the most important things are to get good prenatal care and to follow your health care provider's instructions. Prenatal care is all the medical care you receive before the baby's birth. This care will help prevent, find, and treat any problems during the pregnancy and childbirth. BODY CHANGES Your body goes through many changes during pregnancy. The changes vary from woman to woman.   You may gain or lose a couple of pounds at first.  You may feel sick to your stomach (nauseous) and throw up (vomit). If the vomiting is uncontrollable, call your health care provider.  You may tire easily.  You may develop headaches that can be relieved by medicines approved by your health care provider.  You may urinate more often. Painful urination may mean you have a bladder infection.  You may develop heartburn as a result of your pregnancy.  You may develop constipation because certain hormones are causing the muscles that push waste through your intestines to slow down.  You may develop hemorrhoids or swollen, bulging veins (varicose veins).  Your breasts may begin to grow larger and become tender. Your nipples may stick out more, and the tissue that surrounds them (areola) may become darker.  Your gums may bleed and may be sensitive to brushing and flossing.  Dark spots or blotches (chloasma, mask of pregnancy) may develop on your face. This will likely fade after the baby is born.  Your menstrual periods will stop.  You may have a loss of appetite.  You may develop cravings for certain kinds of food.  You may have changes in your emotions from day to day, such as being excited to be pregnant or being concerned that something may go wrong with the pregnancy and baby.  You may have more vivid and  strange dreams.  You may have changes in your hair. These can include thickening of your hair, rapid growth, and changes in texture. Some women also have hair loss during or after pregnancy, or hair that feels dry or thin. Your hair will most likely return to normal after your baby is born. WHAT TO EXPECT AT YOUR PRENATAL VISITS During a routine prenatal visit:  You will be weighed to make sure you and the baby are growing normally.  Your blood pressure will be taken.  Your abdomen will be measured to track your baby's growth.  The fetal heartbeat will be listened to starting around week 10 or 12 of your pregnancy.  Test results from any previous visits will be discussed. Your health care provider may ask you:  How you are feeling.  If you are feeling the baby move.  If you have had any abnormal symptoms, such as leaking fluid, bleeding, severe headaches, or abdominal cramping.  If you are using any tobacco products, including cigarettes, chewing tobacco, and electronic cigarettes.  If you  have any questions. Other tests that may be performed during your first trimester include:  Blood tests to find your blood type and to check for the presence of any previous infections. They will also be used to check for low iron levels (anemia) and Rh antibodies. Later in the pregnancy, blood tests for diabetes will be done along with other tests if problems develop.  Urine tests to check for infections, diabetes, or protein in the urine.  An ultrasound to confirm the proper growth and development of the baby.  An amniocentesis to check for possible genetic problems.  Fetal screens for spina bifida and Down syndrome.  You may need other tests to make sure you and the baby are doing well.  HIV (human immunodeficiency virus) testing. Routine prenatal testing includes screening for HIV, unless you choose not to have this test. HOME CARE INSTRUCTIONS  Medicines  Follow your health care  provider's instructions regarding medicine use. Specific medicines may be either safe or unsafe to take during pregnancy.  Take your prenatal vitamins as directed.  If you develop constipation, try taking a stool softener if your health care provider approves. Diet  Eat regular, well-balanced meals. Choose a variety of foods, such as meat or vegetable-based protein, fish, milk and low-fat dairy products, vegetables, fruits, and whole grain breads and cereals. Your health care provider will help you determine the amount of weight gain that is right for you.  Avoid raw meat and uncooked cheese. These carry germs that can cause birth defects in the baby.  Eating four or five small meals rather than three large meals a day may help relieve nausea and vomiting. If you start to feel nauseous, eating a few soda crackers can be helpful. Drinking liquids between meals instead of during meals also seems to help nausea and vomiting.  If you develop constipation, eat more high-fiber foods, such as fresh vegetables or fruit and whole grains. Drink enough fluids to keep your urine clear or pale yellow. Activity and Exercise  Exercise only as directed by your health care provider. Exercising will help you:  Control your weight.  Stay in shape.  Be prepared for labor and delivery.  Experiencing pain or cramping in the lower abdomen or low back is a good sign that you should stop exercising. Check with your health care provider before continuing normal exercises.  Try to avoid standing for long periods of time. Move your legs often if you must stand in one place for a long time.  Avoid heavy lifting.  Wear low-heeled shoes, and practice good posture.  You may continue to have sex unless your health care provider directs you otherwise. Relief of Pain or Discomfort  Wear a good support bra for breast tenderness.   Take warm sitz baths to soothe any pain or discomfort caused by hemorrhoids. Use  hemorrhoid cream if your health care provider approves.   Rest with your legs elevated if you have leg cramps or low back pain.  If you develop varicose veins in your legs, wear support hose. Elevate your feet for 15 minutes, 3-4 times a day. Limit salt in your diet. Prenatal Care  Schedule your prenatal visits by the twelfth week of pregnancy. They are usually scheduled monthly at first, then more often in the last 2 months before delivery.  Write down your questions. Take them to your prenatal visits.  Keep all your prenatal visits as directed by your health care provider. Safety  Wear your seat belt at  all times when driving.  Make a list of emergency phone numbers, including numbers for family, friends, the hospital, and police and fire departments. General Tips  Ask your health care provider for a referral to a local prenatal education class. Begin classes no later than at the beginning of month 6 of your pregnancy.  Ask for help if you have counseling or nutritional needs during pregnancy. Your health care provider can offer advice or refer you to specialists for help with various needs.  Do not use hot tubs, steam rooms, or saunas.  Do not douche or use tampons or scented sanitary pads.  Do not cross your legs for long periods of time.  Avoid cat litter boxes and soil used by cats. These carry germs that can cause birth defects in the baby and possibly loss of the fetus by miscarriage or stillbirth.  Avoid all smoking, herbs, alcohol, and medicines not prescribed by your health care provider. Chemicals in these affect the formation and growth of the baby.  Do not use any tobacco products, including cigarettes, chewing tobacco, and electronic cigarettes. If you need help quitting, ask your health care provider. You may receive counseling support and other resources to help you quit.  Schedule a dentist appointment. At home, brush your teeth with a soft toothbrush and be  gentle when you floss. SEEK MEDICAL CARE IF:   You have dizziness.  You have mild pelvic cramps, pelvic pressure, or nagging pain in the abdominal area.  You have persistent nausea, vomiting, or diarrhea.  You have a bad smelling vaginal discharge.  You have pain with urination.  You notice increased swelling in your face, hands, legs, or ankles. SEEK IMMEDIATE MEDICAL CARE IF:   You have a fever.  You are leaking fluid from your vagina.  You have spotting or bleeding from your vagina.  You have severe abdominal cramping or pain.  You have rapid weight gain or loss.  You vomit blood or material that looks like coffee grounds.  You are exposed to [place] measles and have never had them.  You are exposed to fifth disease or chickenpox.  You develop a severe headache.  You have shortness of breath.  You have any kind of trauma, such as from a fall or a car accident.   This information is not intended to replace advice given to you by your health care provider. Make sure you discuss any questions you have with your health care provider.   Document Released: 12/26/2000 Document Revised: 01/22/2014 Document Reviewed: 11/11/2012 Elsevier Interactive Patient Education [employer] Inc[employer]

## 2015-01-08 NOTE — ED Notes (Signed)
Pt in today c/o abd cramping and spotting following several positive pregnancy tests. Took two tests last week and two today, cramping and spotting started 30 min PTA.

## 2015-01-11 LAB — HIV ANTIBODY (ROUTINE TESTING W REFLEX): HIV Screen 4th Generation wRfx: NONREACTIVE

## 2015-01-14 LAB — GC/CHLAMYDIA PROBE AMP (~~LOC~~) NOT AT ARMC
Chlamydia: POSITIVE — AB
Neisseria Gonorrhea: NEGATIVE

## 2015-01-20 ENCOUNTER — Telehealth (HOSPITAL_COMMUNITY): Payer: Self-pay

## 2015-01-20 NOTE — Telephone Encounter (Signed)
Pt informed of dx, need for addl tx, notify partner and abstain form sex x 2 wks post tx.  Rx called into [facility] [phone] and left on VM.

## 2015-01-20 NOTE — Telephone Encounter (Signed)
Chart reviewed by B. Laveda Normanran PA "Zithromax 1,000 mg po once" DHHS form completed and faxed. 01/20/15 @ 15:11 LVM requesting callback.  [facility]

## 2015-01-25 ENCOUNTER — Encounter (HOSPITAL_BASED_OUTPATIENT_CLINIC_OR_DEPARTMENT_OTHER): Payer: Self-pay

## 2015-01-25 DIAGNOSIS — O10011 Pre-existing essential hypertension complicating pregnancy, first trimester: Secondary | ICD-10-CM | POA: Insufficient documentation

## 2015-01-25 DIAGNOSIS — Z3A01 Less than 8 weeks gestation of pregnancy: Secondary | ICD-10-CM | POA: Insufficient documentation

## 2015-01-25 DIAGNOSIS — O99331 Smoking (tobacco) complicating pregnancy, first trimester: Secondary | ICD-10-CM | POA: Insufficient documentation

## 2015-01-25 DIAGNOSIS — O23591 Infection of other part of genital tract in pregnancy, first trimester: Secondary | ICD-10-CM | POA: Insufficient documentation

## 2015-01-25 DIAGNOSIS — Z8639 Personal history of other endocrine, nutritional and metabolic disease: Secondary | ICD-10-CM | POA: Insufficient documentation

## 2015-01-25 DIAGNOSIS — F17210 Nicotine dependence, cigarettes, uncomplicated: Secondary | ICD-10-CM | POA: Insufficient documentation

## 2015-01-25 DIAGNOSIS — O9989 Other specified diseases and conditions complicating pregnancy, childbirth and the puerperium: Secondary | ICD-10-CM | POA: Diagnosis present

## 2015-01-25 LAB — URINALYSIS, ROUTINE W REFLEX MICROSCOPIC
GLUCOSE, UA: NEGATIVE mg/dL
HGB URINE DIPSTICK: NEGATIVE
Ketones, ur: NEGATIVE mg/dL
Leukocytes, UA: NEGATIVE
Nitrite: NEGATIVE
Protein, ur: NEGATIVE mg/dL
SPECIFIC GRAVITY, URINE: 1.03 (ref 1.005–1.030)
pH: 6 (ref 5.0–8.0)

## 2015-01-25 NOTE — ED Notes (Signed)
C/o abd cramping since 12/24 when she was notified she was pregnant-also c/o vaginal itching and d/c-no bleeding

## 2015-01-26 ENCOUNTER — Emergency Department (HOSPITAL_BASED_OUTPATIENT_CLINIC_OR_DEPARTMENT_OTHER): Payer: Medicaid Other

## 2015-01-26 ENCOUNTER — Emergency Department (HOSPITAL_BASED_OUTPATIENT_CLINIC_OR_DEPARTMENT_OTHER)
Admission: EM | Admit: 2015-01-26 | Discharge: 2015-01-26 | Disposition: A | Discharge status: Home / Self Care | Payer: Medicaid Other | Attending: Emergency Medicine | Admitting: Emergency Medicine

## 2015-01-26 DIAGNOSIS — N760 Acute vaginitis: Secondary | ICD-10-CM

## 2015-01-26 DIAGNOSIS — B9689 Other specified bacterial agents as the cause of diseases classified elsewhere: Secondary | ICD-10-CM

## 2015-01-26 DIAGNOSIS — O200 Threatened abortion: Secondary | ICD-10-CM

## 2015-01-26 DIAGNOSIS — Z3491 Encounter for supervision of normal pregnancy, unspecified, first trimester: Secondary | ICD-10-CM

## 2015-01-26 LAB — WET PREP, GENITAL
Sperm: NONE SEEN
Trich, Wet Prep: NONE SEEN
Yeast Wet Prep HPF POC: NONE SEEN

## 2015-01-26 MED ORDER — METRONIDAZOLE 500 MG PO TABS: 500.0000 mg | ORAL_TABLET | Freq: Two times a day (BID) | ORAL | Status: DC

## 2015-01-26 NOTE — ED Provider Notes (Signed)
CSN: [id number]     Arrival date & time 01/25/15  2250 History   First MD Initiated Contact with Patient 01/26/15 0133     Chief Complaint  Patient presents with  . Pelvic Pain     (Consider location/radiation/quality/duration/timing/severity/associated sxs/prior Treatment) HPI  This is a 35 year old female who was treated for abdominal cramping on 01/08/2015. Her chlamydia test was positive and she was subsequently treated with 1 gram of azithromycin. She has not had sexual intercourse since being treated. She returns with some continued abdominal cramping which is mild. She is also developed a white vaginal discharge with burning, itching and an abnormal vaginal odor. She denies bleeding.  Past Medical History  Diagnosis Date  . Thyroid disease   . Hypertension   . Migraine    History reviewed. No pertinent past surgical history. No family history on file. Social History  Substance Use Topics  . Smoking status: Current Every Day Smoker -- 0.50 packs/day    Types: Cigarettes  . Smokeless tobacco: None  . Alcohol Use: No   OB History    Gravida Para Term Preterm AB TAB SAB Ectopic Multiple Living   5 3             Review of Systems  All other systems reviewed and are negative.   Allergies  Benadryl  Home Medications   Prior to Admission medications   Not on File   BP 149/96 mmHg  Pulse 93  Temp(Src) 98.6 F (37 C) (Oral)  Resp 16  SpO2 100%  LMP 12/12/2014   Physical Exam  General: Well-developed, well-nourished female in no acute distress; appearance consistent with age of record HENT: normocephalic; atraumatic Eyes: pupils equal, round and reactive to light; extraocular muscles intact Neck: supple Heart: regular rate and rhythm Lungs: clear to auscultation bilaterally Abdomen: soft; nondistended; nontender; no masses or hepatosplenomegaly; bowel sounds present GU: Normal external genitalia; white vaginal discharge; no vaginal bleeding; no cervical motion  tenderness; mild right adnexal tenderness Extremities: No deformity; full range of motion; pulses normal Neurologic: Awake, alert and oriented; motor function intact in all extremities and symmetric; no facial droop Skin: Warm and dry Psychiatric: Normal mood and affect    ED Course  Procedures (including critical care time)   MDM   Nursing notes and vitals signs, including pulse oximetry, reviewed.  Summary of this visit's results, reviewed by myself:  Labs:  Results for orders placed or performed during the hospital encounter of 01/26/15 (from the past 24 hour(s))  Urinalysis, Routine w reflex microscopic (not at [facility])     Status: Abnormal   Collection Time: 01/25/15 11:00 PM  Result Value Ref Range   Color, Urine YELLOW YELLOW   APPearance CLOUDY (A) CLEAR   Specific Gravity, Urine 1.030 1.005 - 1.030   pH 6.0 5.0 - 8.0   Glucose, UA NEGATIVE NEGATIVE mg/dL   Hgb urine dipstick NEGATIVE NEGATIVE   Bilirubin Urine SMALL (A) NEGATIVE   Ketones, ur NEGATIVE NEGATIVE mg/dL   Protein, ur NEGATIVE NEGATIVE mg/dL   Nitrite NEGATIVE NEGATIVE   Leukocytes, UA NEGATIVE NEGATIVE  Wet prep, genital     Status: Abnormal   Collection Time: 01/26/15  1:45 AM  Result Value Ref Range   Yeast Wet Prep HPF POC NONE SEEN NONE SEEN   Trich, Wet Prep NONE SEEN NONE SEEN   Clue Cells Wet Prep HPF POC PRESENT (A) NONE SEEN   WBC, Wet Prep HPF POC MANY (A) NONE SEEN   Sperm  NONE SEEN     Imaging Studies: [place] Ob Comp Less 14 Wks  01/26/2015  CLINICAL DATA:  Pregnant patient with cramping in early pregnancy. EXAM: OBSTETRIC <14 WK US AND TRANSVAGINAL OB US TECHNIQUE: Both transabdominal and transvaginal ultrasound examinations were performed for complete evaluation of the gestation as well as the maternal uterus, adnexal regions, and pelvic cul-de-sac. Transvaginal technique was performed to assess early pregnancy. COMPARISON:  None. FINDINGS: Intrauterine gestational sac: Visualized/normal in  shape. Yolk sac:  Present. Embryo:  Present Cardiac Activity: Present. Heart Rate: 123  bpm CRL:  8  mm   6 w   6 d                  US EDC: 09/14/2015 Subchorionic hemorrhage:  None visualized. Maternal uterus/adnexae: Right ovary is normal. Probable corpus luteal cyst in the left ovary. No adnexal mass or pelvic free fluid. IMPRESSION: Single live intrauterine pregnancy estimated gestational age [redacted] weeks 6 days for estimated date of delivery 09/14/2015. No subchorionic hemorrhage or complication. Electronically Signed   By: Rubye OaksMelanie  Ehinger M.D.   On: 01/26/2015 01:04   [place] Ob Transvaginal  01/26/2015  CLINICAL DATA:  Pregnant patient with cramping in early pregnancy. EXAM: OBSTETRIC <14 WK US AND TRANSVAGINAL OB US TECHNIQUE: Both transabdominal and transvaginal ultrasound examinations were performed for complete evaluation of the gestation as well as the maternal uterus, adnexal regions, and pelvic cul-de-sac. Transvaginal technique was performed to assess early pregnancy. COMPARISON:  None. FINDINGS: Intrauterine gestational sac: Visualized/normal in shape. Yolk sac:  Present. Embryo:  Present Cardiac Activity: Present. Heart Rate: 123  bpm CRL:  8  mm   6 w   6 d                  US EDC: 09/14/2015 Subchorionic hemorrhage:  None visualized. Maternal uterus/adnexae: Right ovary is normal. Probable corpus luteal cyst in the left ovary. No adnexal mass or pelvic free fluid. IMPRESSION: Single live intrauterine pregnancy estimated gestational age [redacted] weeks 6 days for estimated date of delivery 09/14/2015. No subchorionic hemorrhage or complication. Electronically Signed   By: Rubye OaksMelanie  Ehinger M.D.   On: 01/26/2015 01:04       Paula LibraJohn Kebra Lowrimore, MD 01/26/15 0210

## 2015-01-26 NOTE — Discharge Instructions (Signed)

## 2015-01-27 LAB — GC/CHLAMYDIA PROBE AMP (~~LOC~~) NOT AT ARMC
Chlamydia: POSITIVE — AB
Neisseria Gonorrhea: NEGATIVE

## 2015-05-28 ENCOUNTER — Encounter (HOSPITAL_BASED_OUTPATIENT_CLINIC_OR_DEPARTMENT_OTHER): Payer: Self-pay

## 2015-05-28 ENCOUNTER — Emergency Department (HOSPITAL_BASED_OUTPATIENT_CLINIC_OR_DEPARTMENT_OTHER)
Admission: EM | Admit: 2015-05-28 | Discharge: 2015-05-29 | Discharge status: Against Medical Advice | Payer: Medicaid Other | Attending: Emergency Medicine | Admitting: Emergency Medicine

## 2015-05-28 ENCOUNTER — Telehealth: Payer: Self-pay | Admitting: *Deleted

## 2015-05-28 DIAGNOSIS — Z3A23 23 weeks gestation of pregnancy: Secondary | ICD-10-CM | POA: Insufficient documentation

## 2015-05-28 DIAGNOSIS — Z5320 Procedure and treatment not carried out because of patient's decision for unspecified reasons: Secondary | ICD-10-CM

## 2015-05-28 DIAGNOSIS — O99332 Smoking (tobacco) complicating pregnancy, second trimester: Secondary | ICD-10-CM | POA: Insufficient documentation

## 2015-05-28 DIAGNOSIS — F17210 Nicotine dependence, cigarettes, uncomplicated: Secondary | ICD-10-CM | POA: Diagnosis not present

## 2015-05-28 DIAGNOSIS — Z5329 Procedure and treatment not carried out because of patient's decision for other reasons: Secondary | ICD-10-CM

## 2015-05-28 DIAGNOSIS — O162 Unspecified maternal hypertension, second trimester: Secondary | ICD-10-CM | POA: Diagnosis not present

## 2015-05-28 DIAGNOSIS — O4700 False labor before 37 completed weeks of gestation, unspecified trimester: Secondary | ICD-10-CM

## 2015-05-28 DIAGNOSIS — O479 False labor, unspecified: Secondary | ICD-10-CM

## 2015-05-28 LAB — URINALYSIS, ROUTINE W REFLEX MICROSCOPIC
Bilirubin Urine: NEGATIVE
GLUCOSE, UA: NEGATIVE mg/dL
Hgb urine dipstick: NEGATIVE
Ketones, ur: NEGATIVE mg/dL
Nitrite: NEGATIVE
PH: 6.5 (ref 5.0–8.0)
Protein, ur: NEGATIVE mg/dL
SPECIFIC GRAVITY, URINE: 1.022 (ref 1.005–1.030)

## 2015-05-28 LAB — URINE MICROSCOPIC-ADD ON

## 2015-05-28 MED ORDER — FAMOTIDINE IN NACL 20-0.9 MG/50ML-% IV SOLN
20.0000 mg | Freq: Once | INTRAVENOUS | Status: AC
  Administered 2015-05-29: 20 mg via INTRAVENOUS
  Filled 2015-05-28: qty 50

## 2015-05-28 MED ORDER — LACTATED RINGERS IV BOLUS (SEPSIS)
1000.0000 mL | Freq: Once | INTRAVENOUS | Status: AC
  Administered 2015-05-28: 1000 mL via INTRAVENOUS

## 2015-05-28 NOTE — ED Notes (Signed)
Spoke with Carollee HerterShannon ED-RN and told of Efm tracing looks like fhr may be having decels and to please change pt's position and adjust fetal monitor.

## 2015-05-28 NOTE — Progress Notes (Signed)
Donna,EDRN, the pt's nurse, called [facility] after Carollee HerterShannon, RN relayed message to change pt's position due to possible decels. Donna,RN explained that pt had just gotten back from bathroom and cardio was tracing maternal hr; so decided to leave pt in same position since fhr is tracing better and if any more decels then the RN will change pt's position.

## 2015-05-28 NOTE — ED Notes (Signed)
[facility] spoke with ED DR Donnald GarrePfeiffer and Dr Laureen AbrahamsHarraway-Smith(OB attending), [facility]; ED planning to collect urine for u/a and give fluid bolus

## 2015-05-28 NOTE — ED Notes (Signed)
MD talking with RR RN.

## 2015-05-28 NOTE — ED Notes (Signed)
Due date 09/18/15.  Reports contractions x 1 hour, approx every 20 minutes.

## 2015-05-28 NOTE — ED Notes (Signed)
[facility] called to assess pt's efm; pt in due to contractions x ; white discharge from vagina/no fluid, no vaginal bleeding, positive fetal movement, c/o a bump in perineal area that itches that pt would like checked out.

## 2015-05-28 NOTE — ED Notes (Signed)
Pt states she is G5P3 and has been having contractions for about 20 minutes. LMP Dec 12, 2014. Also c/o white d/c x 2-3 days and wants a bump checked that she noticed today on her vagina. +itching. Placed on Toco and OB RR RN notified.

## 2015-05-28 NOTE — ED Notes (Signed)
MD at bedside. 

## 2015-05-28 NOTE — ED Provider Notes (Addendum)
CSN: [id number]     Arrival date & time 05/28/15  2100 History  By signing my name below, I, Jaime HaggisGabriella Dunlap, attest that this documentation has been prepared under the direction and in the presence of Arby BarretteMarcy Debbrah Sampedro, MD. Electronically Signed: Phillis HaggisGabriella Dunlap, ED Scribe. 05/28/2015. 10:26 PM.   Chief Complaint  Patient presents with  . Contractions   The history is provided by the patient. No language interpreter was used.  HPI Comments: Jaime Dunlap is a 415P3 35 y.o. female who is currently [redacted] weeks pregnant with her fourth child presents to the Emergency Department complaining of contractions onset 2 hours ago. Pt states that the contractions occur about every 20 minutes. Pt states that she has been having contractions intermittently over the past week, but were most severe today. She believes they might be [employer] Corporation[employer]xton Hicks contractions. She reports associated thin, white vaginal discharge for the past 2 days and an itching "bump" on her vagina. She reports hx of abscesses to the vaginal area. Pt's due date is 09/18/15. She denies prior complications for this pregnancy. Pt has had her normal prenatal care. She denies leg pain or swelling, abdominal pain, vaginal bleeding or dysuria. She is due to see her OB/GYN at [facility] on 06/01/15.  Past Medical History  Diagnosis Date  . Thyroid disease   . Hypertension   . Migraine    History reviewed. No pertinent past surgical history. History reviewed. No pertinent family history. Social History  Substance Use Topics  . Smoking status: Current Every Day Smoker -- 0.50 packs/day    Types: Cigarettes  . Smokeless tobacco: None  . Alcohol Use: No   OB History    Gravida Para Term Preterm AB TAB SAB Ectopic Multiple Living   5 3             Review of Systems 10 Systems reviewed and all are negative for acute change except as noted in the HPI.  Allergies  Benadryl  Home Medications   Prior to Admission medications   Medication Sig Start  Date End Date Taking? Authorizing Provider  metroNIDAZOLE (FLAGYL) 500 MG tablet Take 1 tablet (500 mg total) by mouth 2 (two) times daily. One po bid x 7 days 01/26/15   Paula LibraJohn Molpus, MD   BP 122/78 mmHg  Pulse 88  Temp(Src) 98.2 F (36.8 C) (Oral)  Resp 18  Ht 4\' 11"  (1.499 m)  Wt 124 lb (56.246 kg)  BMI 25.03 kg/m2  SpO2 100%  LMP 12/12/2014 Physical Exam  Constitutional: She is oriented to person, place, and time. She appears well-developed and well-nourished.  Non-toxic appearance. No distress.  HENT:  Head: Normocephalic and atraumatic.  Mouth/Throat: Oropharynx is clear and moist. No oropharyngeal exudate.  Eyes: Conjunctivae and EOM are normal. Pupils are equal, round, and reactive to light. No scleral icterus.  Neck: Normal range of motion. Neck supple.  Cardiovascular: Normal rate and regular rhythm.   Pulmonary/Chest: Effort normal and breath sounds normal. No respiratory distress.  Abdominal: Soft. There is no tenderness.  Gravid uterus, non-tender to palpation  Genitourinary:  Bimanual exam: Cervix is long and closed  Musculoskeletal: Normal range of motion. She exhibits no edema.  No peripheral edema  Neurological: She is alert and oriented to person, place, and time. She has normal reflexes. No cranial nerve deficit. She exhibits normal muscle tone. Coordination normal.  Movements purposeful and symmetric  Skin: Skin is warm.  Psychiatric: She has a normal mood and affect. Her behavior is normal.  Nursing note and vitals reviewed.   ED Course  Procedures (including critical care time) DIAGNOSTIC STUDIES: Oxygen Saturation is 99% on RA, normal by my interpretation.    COORDINATION OF CARE: 10:11 PM-Discussed treatment plan with pt at bedside and pt agreed to plan.    Labs Review Labs Reviewed  URINALYSIS, ROUTINE W REFLEX MICROSCOPIC (NOT AT [facility]) - Abnormal; Notable for the following:    Leukocytes, UA SMALL (*)    All other components within normal limits   URINE MICROSCOPIC-ADD ON - Abnormal; Notable for the following:    Squamous Epithelial / LPF 0-5 (*)    Bacteria, UA FEW (*)    All other components within normal limits    Imaging Review No results found. I have personally reviewed and evaluated these images and lab results as part of my medical decision-making.   EKG Interpretation None      Consult: [facility] OB nurse has been able to monitor the patient's tocometry and fetal heart rate for rapid response OB. She notes small contractions which by my observation the patient are not severely painful. Fetal heart tones have been good. His reviewed this with Dr. Lowry Ram. At this time advice is to transfer the patient to [facility] for ongoing monitoring. Also plan is to initiate a liter of LR and obtain urinalysis. MDM   Final diagnoses:  Premature uterine contractions  Left against medical advice   Patient states she's been having mild contractions for a week. Symptoms more intense this evening. She reports a fairly irregular. Due to her extensive prior pregnancies, she assumed these were Braxton Hicks contractions. She has a nontender abdomen and when she is experiencing these contractions does not appear to be in severe pain. Sterile glove manual examination shows the cervix to be closed. OB has been consulted and patient will be transferred to maternity hospital for going monitoring of contractions in early pregnancy. She has had no fluid leak and no bleeding.  00:15 patient reports her contractions have stopped and she feels fine now. She reports she does not think she needs to go on for further observation for contractions at this time. She reports she has had this happen in her prior pregnancy with her daughter and after observation she is discharged. She is aware of risk of possible preterm delivery. Patient reports that she will return immediately if she has any other problems. She is counseled to follow-up with her OB  doctor on Monday. Patient is signed out AMA.   Arby Barrette, MD 05/28/15 2346  Arby Barrette, MD 05/29/15 [phone]

## 2015-05-29 NOTE — ED Notes (Signed)
Spoke with Yehuda MaoLeeAnn to give update on pt.

## 2015-05-29 NOTE — Discharge Instructions (Signed)
Possible Braxton Hicks Contractions You were advised to be transferred to the [facility] hospital for ongoing monitoring for contractions. Return to the emergency department immediately if he had leakage of vaginal fluid, ongoing contractions, abdominal pain or other concerning symptoms. See her OB doctor as soon as possible for recheck. Contractions of the uterus can occur throughout pregnancy. Contractions are not always a sign that you are in labor.  WHAT ARE BRAXTON HICKS CONTRACTIONS?  Contractions that occur before labor are called Braxton Hicks contractions, or false labor. Toward the end of pregnancy (32-34 weeks), these contractions can develop more often and may become more forceful. This is not true labor because these contractions do not result in opening (dilatation) and thinning of the cervix. They are sometimes difficult to tell apart from true labor because these contractions can be forceful and people have different pain tolerances. You should not feel embarrassed if you go to the hospital with false labor. Sometimes, the only way to tell if you are in true labor is for your health care provider to look for changes in the cervix. If there are no prenatal problems or other health problems associated with the pregnancy, it is completely safe to be sent home with false labor and await the onset of true labor. HOW CAN YOU TELL THE DIFFERENCE BETWEEN TRUE AND FALSE LABOR? False Labor  The contractions of false labor are usually shorter and not as hard as those of true labor.   The contractions are usually irregular.   The contractions are often felt in the front of the lower abdomen and in the groin.   The contractions may go away when you walk around or change positions while lying down.   The contractions get weaker and are shorter lasting as time goes on.   The contractions do not usually become progressively stronger, regular, and closer together as with true labor.  True  Labor  Contractions in true labor last 30-70 seconds, become very regular, usually become more intense, and increase in frequency.   The contractions do not go away with walking.   The discomfort is usually felt in the top of the uterus and spreads to the lower abdomen and low back.   True labor can be determined by your health care provider with an exam. This will show that the cervix is dilating and getting thinner.  WHAT TO REMEMBER  Keep up with your usual exercises and follow other instructions given by your health care provider.   Take medicines as directed by your health care provider.   Keep your regular prenatal appointments.   Eat and drink lightly if you think you are going into labor.   If Braxton Hicks contractions are making you uncomfortable:   Change your position from lying down or resting to walking, or from walking to resting.   Sit and rest in a tub of warm water.   Drink 2-3 glasses of water. Dehydration may cause these contractions.   Do slow and deep breathing several times an hour.  WHEN SHOULD I SEEK IMMEDIATE MEDICAL CARE? Seek immediate medical care if:  Your contractions become stronger, more regular, and closer together.   You have fluid leaking or gushing from your vagina.   You have a fever.   You pass blood-tinged mucus.   You have vaginal bleeding.   You have continuous abdominal pain.   You have low back pain that you never had before.   You feel your baby's head pushing down and  causing pelvic pressure.   Your baby is not moving as much as it used to.    This information is not intended to replace advice given to you by your health care provider. Make sure you discuss any questions you have with your health care provider.   Document Released: 01/01/2005 Document Revised: 01/06/2013 Document Reviewed: 10/13/2012 Elsevier Interactive Patient Education 2016 [employer] Acknowledgement of Risk of  Discharge  Against Medical Advice   And Release of Liability    Because I am choosing to leave the hospital in spite of these risks, I release the hospital, its employees and officers, and my attending physician from all liability for any adverse results caused by my leaving the hospital prematurely.   ________________________________      _____________________________________ Patient's Signature                                        Date and Time   ________________________________      _____________________________________ Witness' Signature                                        Relationship to Patient    ________________________________      _____________________________________ Witness' Signature                                        Relationship to Patient   [If patient refuses to sign, write "Patient refused to sign" on the patient's signature line and have witnesses to the refusal sign as witnesses.]

## 2015-05-29 NOTE — ED Notes (Signed)
MD at bedside. 

## 2015-05-29 NOTE — ED Notes (Signed)
Pt refused transfer for further fetal monitoring.  Verbalized understanding and risk of leaving AMA

## 2015-11-12 ENCOUNTER — Emergency Department (HOSPITAL_BASED_OUTPATIENT_CLINIC_OR_DEPARTMENT_OTHER)
Admission: EM | Admit: 2015-11-12 | Discharge: 2015-11-13 | Disposition: A | Discharge status: Home / Self Care | Payer: Medicaid Other | Attending: Emergency Medicine | Admitting: Emergency Medicine

## 2015-11-12 ENCOUNTER — Encounter (HOSPITAL_BASED_OUTPATIENT_CLINIC_OR_DEPARTMENT_OTHER): Payer: Self-pay | Admitting: Adult Health

## 2015-11-12 DIAGNOSIS — Z202 Contact with and (suspected) exposure to infections with a predominantly sexual mode of transmission: Secondary | ICD-10-CM | POA: Diagnosis not present

## 2015-11-12 DIAGNOSIS — I10 Essential (primary) hypertension: Secondary | ICD-10-CM | POA: Insufficient documentation

## 2015-11-12 DIAGNOSIS — F17210 Nicotine dependence, cigarettes, uncomplicated: Secondary | ICD-10-CM | POA: Insufficient documentation

## 2015-11-12 DIAGNOSIS — Z79899 Other long term (current) drug therapy: Secondary | ICD-10-CM | POA: Insufficient documentation

## 2015-11-12 NOTE — ED Provider Notes (Signed)
[facility] Provider Note: Lowella DellJ. Lane Elorah Dewing, MD, FACEP  CSN: [id number] MRN: [id number] ARRIVAL: 11/12/15 at 2332 ROOM: MH05/[facility]05   CHIEF COMPLAINT  Exposure to STD   HISTORY OF PRESENT ILLNESS  Jaime Dunlap is a 35 y.o. female sexual partner was reportedly treated for chlamydia 2 days ago. She is asymptomatic she denies vaginal discharge, vaginal bleeding, abdominal pain or urinary symptoms.. She is here to be checked.    Past Medical History:  Diagnosis Date  . Hypertension   . Migraine   . Thyroid disease     History reviewed. No pertinent surgical history.  History reviewed. No pertinent family history.  Social History  Substance Use Topics  . Smoking status: Current Every Day Smoker    Packs/day: 0.50    Types: Cigarettes  . Smokeless tobacco: Never Used  . Alcohol use No    Prior to Admission medications   Medication Sig Start Date End Date Taking? Authorizing Provider  metroNIDAZOLE (FLAGYL) 500 MG tablet Take 1 tablet (500 mg total) by mouth 2 (two) times daily. One po bid x 7 days 01/26/15   Paula LibraJohn Trevious Rampey, MD    Allergies Benadryl [diphenhydramine hcl]   REVIEW OF SYSTEMS  Negative except as noted here or in the History of Present Illness.   PHYSICAL EXAMINATION  Initial Vital Signs Blood pressure (!) 161/106, pulse 96, temperature 98.1 F (36.7 C), temperature source Oral, resp. rate 14, weight 120 lb (54.4 kg), last menstrual period 10/20/2014, SpO2 100 %, unknown if currently breastfeeding.  Examination General: Well-developed, well-nourished female in no acute distress; appearance consistent with age of record HENT: normocephalic; atraumatic Eyes: pupils equal, round and reactive to light; extraocular muscles intact Neck: supple Heart: regular rate and rhythm Lungs: clear to auscultation bilaterally Abdomen: soft; nondistended; nontender; bowel sounds present GU: Normal external genitalia; no vaginal discharge; no vaginal bleeding;  no cervical motion tenderness Extremities: No deformity; full range of motion; pulses normal Neurologic: Awake, alert and oriented; motor function intact in all extremities and symmetric; no facial droop Skin: Warm and dry Psychiatric: Normal mood and affect   RESULTS  Summary of this visit's results, reviewed by myself:   EKG Interpretation  Date/Time:    Ventricular Rate:    PR Interval:    QRS Duration:   QT Interval:    QTC Calculation:   R Axis:     Text Interpretation:        Laboratory Studies: Results for orders placed or performed during the hospital encounter of 11/12/15 (from the past 24 hour(s))  Urinalysis, Routine w reflex microscopic     Status: Abnormal   Collection Time: 11/13/15 12:01 AM  Result Value Ref Range   Color, Urine YELLOW YELLOW   APPearance CLEAR CLEAR   Specific Gravity, Urine 1.018 1.005 - 1.030   pH 6.0 5.0 - 8.0   Glucose, UA NEGATIVE NEGATIVE mg/dL   Hgb urine dipstick NEGATIVE NEGATIVE   Bilirubin Urine NEGATIVE NEGATIVE   Ketones, ur NEGATIVE NEGATIVE mg/dL   Protein, ur NEGATIVE NEGATIVE mg/dL   Nitrite NEGATIVE NEGATIVE   Leukocytes, UA SMALL (A) NEGATIVE  Pregnancy, urine     Status: None   Collection Time: 11/13/15 12:01 AM  Result Value Ref Range   Preg Test, Ur NEGATIVE NEGATIVE  Wet prep, genital     Status: Abnormal   Collection Time: 11/13/15 12:01 AM  Result Value Ref Range   Yeast Wet Prep HPF POC NONE SEEN NONE SEEN   Trich, Wet  Prep NONE SEEN NONE SEEN   Clue Cells Wet Prep HPF POC PRESENT (A) NONE SEEN   WBC, Wet Prep HPF POC MANY (A) NONE SEEN   Sperm NONE SEEN   Urine microscopic-add on     Status: Abnormal   Collection Time: 11/13/15 12:01 AM  Result Value Ref Range   Squamous Epithelial / LPF 0-5 (A) NONE SEEN   WBC, UA 6-30 0 - 5 WBC/hpf   RBC / HPF 0-5 0 - 5 RBC/hpf   Bacteria, UA FEW (A) NONE SEEN   Urine-Other MUCOUS PRESENT    Imaging Studies: No results found.  ED COURSE  Nursing notes and  initial vitals signs, including pulse oximetry, reviewed.  Vitals:   11/12/15 2340 11/12/15 2341  BP: (!) 161/106   Pulse: 96   Resp: 14   Temp: 98.1 F (36.7 C)   TempSrc: Oral   SpO2: 100%   Weight:  120 lb (54.4 kg)   Eat for chlamydia with Zithromax one gram Patient declines IM Rocephin for GC coverage. The patient is having no urinary symptoms so we will not treat for UTI; urine sent for culture.  PROCEDURES    ED DIAGNOSES     ICD-9-CM ICD-10-CM   1. Exposure to STD V01.6 Z20.2        Paula LibraJohn Grantland Want, MD 11/13/15 [phone]

## 2015-11-12 NOTE — ED Triage Notes (Addendum)
PT's partner was treated here for chlamydia two days ago. Pt is here to be tested. She really does not want treatment if not needed and does not want a shot. Denies vaginal discharge, denies. Partner has given pt STD's in past. Pt reports she is monogamous and they do not use condoms. They have one child together.

## 2015-11-12 NOTE — ED Notes (Signed)
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  Pt here today to get checked for all std's due to partner having std.

## 2015-11-13 LAB — URINALYSIS, ROUTINE W REFLEX MICROSCOPIC
Bilirubin Urine: NEGATIVE
GLUCOSE, UA: NEGATIVE mg/dL
HGB URINE DIPSTICK: NEGATIVE
KETONES UR: NEGATIVE mg/dL
Nitrite: NEGATIVE
PROTEIN: NEGATIVE mg/dL
Specific Gravity, Urine: 1.018 (ref 1.005–1.030)
pH: 6 (ref 5.0–8.0)

## 2015-11-13 LAB — WET PREP, GENITAL
Sperm: NONE SEEN
TRICH WET PREP: NONE SEEN
Yeast Wet Prep HPF POC: NONE SEEN

## 2015-11-13 LAB — PREGNANCY, URINE: Preg Test, Ur: NEGATIVE

## 2015-11-13 LAB — URINE MICROSCOPIC-ADD ON

## 2015-11-13 MED ORDER — AZITHROMYCIN 250 MG PO TABS
1000.0000 mg | ORAL_TABLET | Freq: Once | ORAL | Status: AC
  Administered 2015-11-13: 1000 mg via ORAL
  Filled 2015-11-13: qty 4

## 2015-11-14 LAB — URINE CULTURE: Culture: NO GROWTH

## 2015-11-14 LAB — GC/CHLAMYDIA PROBE AMP (~~LOC~~) NOT AT ARMC
Chlamydia: NEGATIVE
NEISSERIA GONORRHEA: NEGATIVE

## 2016-03-08 ENCOUNTER — Emergency Department (HOSPITAL_BASED_OUTPATIENT_CLINIC_OR_DEPARTMENT_OTHER)
Admission: EM | Admit: 2016-03-08 | Discharge: 2016-03-08 | Disposition: A | Discharge status: Home / Self Care | Payer: Medicaid Other | Attending: Emergency Medicine | Admitting: Emergency Medicine

## 2016-03-08 ENCOUNTER — Encounter (HOSPITAL_BASED_OUTPATIENT_CLINIC_OR_DEPARTMENT_OTHER): Payer: Self-pay | Admitting: *Deleted

## 2016-03-08 DIAGNOSIS — J069 Acute upper respiratory infection, unspecified: Secondary | ICD-10-CM

## 2016-03-08 DIAGNOSIS — F17210 Nicotine dependence, cigarettes, uncomplicated: Secondary | ICD-10-CM | POA: Insufficient documentation

## 2016-03-08 DIAGNOSIS — J029 Acute pharyngitis, unspecified: Secondary | ICD-10-CM

## 2016-03-08 DIAGNOSIS — I10 Essential (primary) hypertension: Secondary | ICD-10-CM | POA: Diagnosis not present

## 2016-03-08 DIAGNOSIS — Z3A12 12 weeks gestation of pregnancy: Secondary | ICD-10-CM | POA: Diagnosis not present

## 2016-03-08 DIAGNOSIS — O26891 Other specified pregnancy related conditions, first trimester: Secondary | ICD-10-CM | POA: Diagnosis present

## 2016-03-08 DIAGNOSIS — O99331 Smoking (tobacco) complicating pregnancy, first trimester: Secondary | ICD-10-CM | POA: Diagnosis not present

## 2016-03-08 DIAGNOSIS — Z331 Pregnant state, incidental: Secondary | ICD-10-CM

## 2016-03-08 DIAGNOSIS — O99511 Diseases of the respiratory system complicating pregnancy, first trimester: Secondary | ICD-10-CM | POA: Insufficient documentation

## 2016-03-08 LAB — HCG, QUANTITATIVE, PREGNANCY: HCG, BETA CHAIN, QUANT, S: 44457 m[IU]/mL — AB (ref <5)

## 2016-03-08 LAB — URINALYSIS, ROUTINE W REFLEX MICROSCOPIC
Bilirubin Urine: NEGATIVE
GLUCOSE, UA: NEGATIVE mg/dL
Hgb urine dipstick: NEGATIVE
KETONES UR: NEGATIVE mg/dL
NITRITE: NEGATIVE
PROTEIN: NEGATIVE mg/dL
Specific Gravity, Urine: 1.014 (ref 1.005–1.030)
pH: 7.5 (ref 5.0–8.0)

## 2016-03-08 LAB — PREGNANCY, URINE: PREG TEST UR: POSITIVE — AB

## 2016-03-08 LAB — URINALYSIS, MICROSCOPIC (REFLEX)

## 2016-03-08 LAB — RAPID STREP SCREEN (MED CTR MEBANE ONLY): STREPTOCOCCUS, GROUP A SCREEN (DIRECT): NEGATIVE

## 2016-03-08 MED ORDER — ACETAMINOPHEN 500 MG PO TABS
1000.0000 mg | ORAL_TABLET | Freq: Once | ORAL | Status: AC
  Administered 2016-03-08: 1000 mg via ORAL

## 2016-03-08 MED ORDER — ACETAMINOPHEN 325 MG PO TABS
650.0000 mg | ORAL_TABLET | Freq: Once | ORAL | Status: DC
  Filled 2016-03-08: qty 2

## 2016-03-08 NOTE — ED Provider Notes (Signed)
[facility] Provider Note   CSN: [id number] Arrival date & time: 03/08/16  1129     History   Chief Complaint Chief Complaint  Patient presents with  . Sore Throat  . Cough    HPI Jaime Dunlap is a 36 y.o. female with no contributory past medical history, presents emergency Department with chief complaint of URI symptoms. Patient states that she has been exposed to URI symptoms by her boyfriend's child last week. She complains of cough, nasal congestion and sore throat that is worse in the morning and at night. She denies any difficulty swallowing. She denies fevers or chills. She has not tried anything for her symptoms. Patient found to be pregnant. Incidentally by urinalysis.  HPI  Past Medical History:  Diagnosis Date  . Hypertension   . Migraine   . Thyroid disease     There are no active problems to display for this patient.   History reviewed. No pertinent surgical history.  OB History    Gravida Para Term Preterm AB Living   5 3           SAB TAB Ectopic Multiple Live Births                   Home Medications    Prior to Admission medications   Medication Sig Start Date End Date Taking? Authorizing Provider  NIFEDIPINE PO Take by mouth.   Yes Historical Provider, MD  metroNIDAZOLE (FLAGYL) 500 MG tablet Take 1 tablet (500 mg total) by mouth 2 (two) times daily. One po bid x 7 days 01/26/15   Paula Libra, MD    Family History No family history on file.  Social History Social History  Substance Use Topics  . Smoking status: Current Every Day Smoker    Packs/day: 0.50    Types: Cigarettes  . Smokeless tobacco: Never Used  . Alcohol use No     Allergies   Benadryl [diphenhydramine hcl]   Review of Systems Review of Systems  Ten systems reviewed and are negative for acute change, except as noted in the HPI.   Physical Exam Updated Vital Signs BP 123/76 (BP Location: Right Arm)   Pulse 90   Temp 98.9 F (37.2 C) (Oral)    Resp 17   Ht 4\' 10"  (1.473 m)   Wt 54 kg   LMP 12/10/2015   SpO2 100%   BMI 24.87 kg/m   Physical Exam Physical Exam  Nursing note and vitals reviewed. Constitutional: She is oriented to person, place, and time. She appears well-developed and well-nourished. No distress.  HENT:  Head: Normocephalic and atraumatic.  Ears: Normal TMs bilaterally Mouth: Mild erythema of the oropharynx. No exudates or swelling. Eyes: Conjunctivae normal and EOM are normal. Pupils are equal, round, and reactive to light. No scleral icterus.  Neck: Normal range of motion.  no lymphadenopathy Cardiovascular: Normal rate, regular rhythm and normal heart sounds.  Exam reveals no gallop and no friction rub.   No murmur heard. Pulmonary/Chest: Effort normal and breath sounds normal. No respiratory distress.  Abdominal: Soft. Bowel sounds are normal. She exhibits no distension and no mass. There is no tenderness. There is no guarding.  Neurological: She is alert and oriented to person, place, and time.  Skin: Skin is warm and dry. She is not diaphoretic.     ED Treatments / Results  Labs (all labs ordered are listed, but only abnormal results are displayed) Labs Reviewed  URINALYSIS, ROUTINE W REFLEX MICROSCOPIC -  Abnormal; Notable for the following:       Result Value   APPearance CLOUDY (*)    Leukocytes, UA SMALL (*)    All other components within normal limits  PREGNANCY, URINE - Abnormal; Notable for the following:    Preg Test, Ur POSITIVE (*)    All other components within normal limits  URINALYSIS, MICROSCOPIC (REFLEX) - Abnormal; Notable for the following:    Bacteria, UA MANY (*)    Squamous Epithelial / LPF 6-30 (*)    All other components within normal limits  HCG, QUANTITATIVE, PREGNANCY - Abnormal; Notable for the following:    hCG, Beta Chain, Quant, S 44,457 (*)    All other components within normal limits  RAPID STREP SCREEN (NOT AT [facility])  CULTURE, GROUP A STREP [facility]    EKG   EKG Interpretation None       Radiology No results found.  Procedures Procedures (including critical care time)  Medications Ordered in ED Medications  acetaminophen (TYLENOL) tablet 1,000 mg (1,000 mg Oral Given 03/08/16 1507)     Initial Impression / Assessment and Plan / ED Course  I have reviewed the triage vital signs and the nursing notes.  Pertinent labs & imaging results that were available during my care of the patient were reviewed by me and considered in my medical decision making (see chart for details).  Clinical Course as of Mar 08 1702  Thu Mar 08, 2016  1314 Preg Test, Ur: (!) POSITIVE [AH]    Clinical Course User Index [AH] Arthor CaptainAbigail Mikya Don, PA-C    Patient with positive pregnancy test. Confirmed with beta hCG. Her last menstrual period was November 28. She is an estimated 12 weeks and 2 days gestational age. Patient given Tylenol for her sore throat. She is a URI without signs of other severe infection. She denies any urinary symptoms. Patient is advised follow-up with OB/GYN. Specifically, for discharge at this time  Final Clinical Impressions(s) / ED Diagnoses   Final diagnoses:  Upper respiratory tract infection, unspecified type  Pharyngitis, unspecified etiology  Normal pregnancy, incidental    New Prescriptions Discharge Medication List as of 03/08/2016  3:14 PM       Arthor CaptainAbigail Kesi Perrow, PA-C 03/08/16 1708    Benjiman CoreNathan Pickering, MD 03/08/16 [phone]

## 2016-03-08 NOTE — ED Notes (Signed)
ED Provider at bedside. 

## 2016-03-08 NOTE — ED Triage Notes (Signed)
Sore throat since last night. Cough this am. Chest soreness when she coughs.

## 2016-03-08 NOTE — Discharge Instructions (Signed)
You appear to have an upper respiratory infection (URI). An upper respiratory tract infection, or cold, is a viral infection of the air passages leading to the lungs. It is contagious and can be spread to others, especially during the first 3 or 4 days. It cannot be cured by antibiotics or other medicines. °RETURN IMMEDIATELY IF you develop shortness of breath, confusion or altered mental status, a new rash, become dizzy, faint, or poorly responsive, or are unable to be cared for at home. ° °

## 2016-03-10 LAB — CULTURE, GROUP A STREP (THRC)

## 2016-03-19 ENCOUNTER — Emergency Department (HOSPITAL_BASED_OUTPATIENT_CLINIC_OR_DEPARTMENT_OTHER)
Admission: EM | Admit: 2016-03-19 | Discharge: 2016-03-19 | Disposition: A | Discharge status: Home / Self Care | Payer: Medicaid Other | Attending: Emergency Medicine | Admitting: Emergency Medicine

## 2016-03-19 ENCOUNTER — Encounter (HOSPITAL_BASED_OUTPATIENT_CLINIC_OR_DEPARTMENT_OTHER): Payer: Self-pay | Admitting: Emergency Medicine

## 2016-03-19 DIAGNOSIS — G43109 Migraine with aura, not intractable, without status migrainosus: Secondary | ICD-10-CM | POA: Diagnosis not present

## 2016-03-19 DIAGNOSIS — O26891 Other specified pregnancy related conditions, first trimester: Secondary | ICD-10-CM | POA: Diagnosis present

## 2016-03-19 DIAGNOSIS — Z79899 Other long term (current) drug therapy: Secondary | ICD-10-CM | POA: Diagnosis not present

## 2016-03-19 DIAGNOSIS — Z3A14 14 weeks gestation of pregnancy: Secondary | ICD-10-CM | POA: Diagnosis not present

## 2016-03-19 DIAGNOSIS — F17210 Nicotine dependence, cigarettes, uncomplicated: Secondary | ICD-10-CM | POA: Insufficient documentation

## 2016-03-19 DIAGNOSIS — I10 Essential (primary) hypertension: Secondary | ICD-10-CM | POA: Diagnosis not present

## 2016-03-19 DIAGNOSIS — O99331 Smoking (tobacco) complicating pregnancy, first trimester: Secondary | ICD-10-CM | POA: Insufficient documentation

## 2016-03-19 MED ORDER — LACTATED RINGERS IV BOLUS (SEPSIS)
500.0000 mL | Freq: Once | INTRAVENOUS | Status: AC
  Administered 2016-03-19: 500 mL via INTRAVENOUS

## 2016-03-19 MED ORDER — METOCLOPRAMIDE HCL 5 MG/ML IJ SOLN
10.0000 mg | Freq: Once | INTRAMUSCULAR | Status: AC
Start: 2016-03-19 — End: 2016-03-19
  Administered 2016-03-19: 10 mg via INTRAVENOUS
  Filled 2016-03-19: qty 2

## 2016-03-19 NOTE — ED Triage Notes (Signed)
Pt having headache similar to ones in the past for 4-5 days..  Right sided pain with some visual changes.  Some nausea and vomiting.  Some neck pain.  No known fever.

## 2016-03-19 NOTE — ED Provider Notes (Signed)
[facility] Provider Note   CSN: [id number] Arrival date & time: 03/19/16  1808   By signing my name below, I, Clovis PuAvnee Patel, attest that this documentation has been prepared under the direction and in the presence of Tilden FossaElizabeth Kristeena Meineke, MD  Electronically Signed: Clovis PuAvnee Patel, ED Scribe. 03/19/16. 9:16 PM.   History   Chief Complaint Chief Complaint  Patient presents with  . Headache   The history is provided by the patient. No language interpreter was used.   HPI Comments Jaime Dunlap is a 36 y.o. female, with a hx of migraines, HTN and preeclampsia, who presents to the Emergency Department complaining of acute onset, persistent headache onset 3 days. She notes it feels like there is an elephant sitting on the right side of her face. Pt also reports mild visual change, photophobia, nausea, vomiting and notes she is [redacted] weeks pregnant. No alleviating factors noted. Pt denies any recent fevers or any other associated symptoms. [id number]  Past Medical History:  Diagnosis Date  . Hypertension   . Migraine   . Thyroid disease     There are no active problems to display for this patient.   No past surgical history on file.  OB History    Gravida Para Term Preterm AB Living   6 3           SAB TAB Ectopic Multiple Live Births                   Home Medications    Prior to Admission medications   Medication Sig Start Date End Date Taking? Authorizing Provider  NIFEDIPINE PO Take by mouth.   Yes Historical Provider, MD    Family History No family history on file.  Social History Social History  Substance Use Topics  . Smoking status: Current Every Day Smoker    Packs/day: 0.50    Types: Cigarettes  . Smokeless tobacco: Never Used  . Alcohol use No     Allergies   Benadryl [diphenhydramine hcl]   Review of Systems Review of Systems  Constitutional: Negative for fever.  Eyes: Positive for photophobia.  Gastrointestinal: Positive for nausea and  vomiting.  Neurological: Positive for headaches.  All other systems reviewed and are negative.  Physical Exam Updated Vital Signs BP 142/93 (BP Location: Right Arm)   Pulse 80   Temp 98.4 F (36.9 C) (Oral)   Resp 16   Ht 4\' 10"  (1.473 m)   Wt 120 lb (54.4 kg)   LMP 12/10/2015   SpO2 100%   BMI 25.08 kg/m   Physical Exam  Constitutional: She is oriented to person, place, and time. She appears well-developed and well-nourished.  HENT:  Head: Normocephalic and atraumatic.  Eyes: EOM are normal. Pupils are equal, round, and reactive to light.  photophobia  Neck: Neck supple.  Cardiovascular: Normal rate and regular rhythm.   Murmur heard. Pulmonary/Chest: Effort normal and breath sounds normal. No respiratory distress.  Abdominal: Soft. There is no tenderness. There is no rebound and no guarding.  Musculoskeletal: She exhibits no edema or tenderness.  Neurological: She is alert and oriented to person, place, and time. No cranial nerve deficit.  Skin: Skin is warm and dry.  Psychiatric: She has a normal mood and affect. Her behavior is normal.  Nursing note and vitals reviewed.    ED Treatments / Results  DIAGNOSTIC STUDIES:  Oxygen Saturation is 100% on RA, normal by my interpretation.    COORDINATION OF CARE:  9:16  PM Discussed treatment plan with pt at bedside and pt agreed to plan.  Labs (all labs ordered are listed, but only abnormal results are displayed) Labs Reviewed - No data to display  EKG  EKG Interpretation None       Radiology No results found.  Procedures Procedures (including critical care time)  Medications Ordered in ED Medications  lactated ringers bolus 500 mL (500 mLs Intravenous New Bag/Given 03/19/16 2131)  metoCLOPramide (REGLAN) injection 10 mg (10 mg Intravenous Given 03/19/16 2131)     Initial Impression / Assessment and Plan / ED Course  I have reviewed the triage vital signs and the nursing notes.  Pertinent labs & imaging  results that were available during my care of the patient were reviewed by me and considered in my medical decision making (see chart for details).     Patient with history of migraine headaches here with symptoms similar to prior migraines. She is nontoxic appearing on examination with no focal neurologic deficits. She is incidentally pregnant on history. She has no pregnancy related complaints. After treatment in the emergency department her symptoms improved. Discussed home care for migraine, first trimester pregnancy. Discussed outpatient follow-up and return precautions. Presentation is not consistent with dural sinus thrombosis, meningitis, subarachnoid hemorrhage.  Final Clinical Impressions(s) / ED Diagnoses   Final diagnoses:  Migraine with aura and without status migrainosus, not intractable    New Prescriptions New Prescriptions   No medications on file  I personally performed the services described in this documentation, which was scribed in my presence. The recorded information has been reviewed and is accurate.     Tilden Fossa, MD 03/19/16 [phone]

## 2016-06-06 ENCOUNTER — Encounter (HOSPITAL_BASED_OUTPATIENT_CLINIC_OR_DEPARTMENT_OTHER): Payer: Self-pay | Admitting: *Deleted

## 2016-06-06 ENCOUNTER — Emergency Department (HOSPITAL_BASED_OUTPATIENT_CLINIC_OR_DEPARTMENT_OTHER)
Admission: EM | Admit: 2016-06-06 | Discharge: 2016-06-06 | Disposition: A | Discharge status: Home / Self Care | Payer: Medicaid Other | Attending: Physician Assistant | Admitting: Physician Assistant

## 2016-06-06 DIAGNOSIS — N3000 Acute cystitis without hematuria: Secondary | ICD-10-CM

## 2016-06-06 DIAGNOSIS — N898 Other specified noninflammatory disorders of vagina: Secondary | ICD-10-CM | POA: Diagnosis present

## 2016-06-06 DIAGNOSIS — F17210 Nicotine dependence, cigarettes, uncomplicated: Secondary | ICD-10-CM | POA: Insufficient documentation

## 2016-06-06 DIAGNOSIS — O99332 Smoking (tobacco) complicating pregnancy, second trimester: Secondary | ICD-10-CM | POA: Diagnosis not present

## 2016-06-06 DIAGNOSIS — O2312 Infections of bladder in pregnancy, second trimester: Secondary | ICD-10-CM | POA: Insufficient documentation

## 2016-06-06 DIAGNOSIS — Z3A26 26 weeks gestation of pregnancy: Secondary | ICD-10-CM | POA: Diagnosis not present

## 2016-06-06 DIAGNOSIS — I10 Essential (primary) hypertension: Secondary | ICD-10-CM | POA: Insufficient documentation

## 2016-06-06 LAB — URINALYSIS, ROUTINE W REFLEX MICROSCOPIC
Glucose, UA: NEGATIVE mg/dL
Hgb urine dipstick: NEGATIVE
KETONES UR: NEGATIVE mg/dL
NITRITE: POSITIVE — AB
Protein, ur: 30 mg/dL — AB
SPECIFIC GRAVITY, URINE: 1.022 (ref 1.005–1.030)
pH: 6.5 (ref 5.0–8.0)

## 2016-06-06 LAB — URINALYSIS, MICROSCOPIC (REFLEX)

## 2016-06-06 MED ORDER — NITROFURANTOIN MONOHYD MACRO 100 MG PO CAPS: 100.0000 mg | ORAL_CAPSULE | Freq: Two times a day (BID) | ORAL | 0 refills | Status: AC

## 2016-06-06 MED ORDER — NITROFURANTOIN MONOHYD MACRO 100 MG PO CAPS: 100.0000 mg | ORAL_CAPSULE | Freq: Two times a day (BID) | ORAL | 0 refills | Status: DC

## 2016-06-06 MED FILL — NITROFURANTOIN MONO-MCR 100: 100 | 5 days supply | Qty: 10 | Fill #0

## 2016-06-06 NOTE — ED Triage Notes (Signed)
Pt reports vag d/c and itching x yesterday, dysuria, low back pain also.

## 2016-06-06 NOTE — ED Provider Notes (Signed)
[facility]   CSN: [id number] Arrival date & time: 06/06/16  1427     History   Chief Complaint Chief Complaint  Patient presents with  . Vaginal Discharge    HPI Jaime Dunlap is a 204P5 36 y.o. female who is [redacted] wks pregnant and is presenting with a 1 wk h/o dysuria, increased urinary frequency, and vaginal discharge. She reports she has a h/o frequent UTIs.  The discharge is white and has a fishy smell. She denies fevers, chills, back pain, or N/V. Vaginal itching began yesterday. She has not tried anything to alleviate her sxs besides cranberry juice.  Pt is high-risk pregnancy due to pre-eclampsia.     HPI  Past Medical History:  Diagnosis Date  . Hypertension   . Migraine   . Thyroid disease     There are no active problems to display for this patient.   History reviewed. No pertinent surgical history.  OB History    Gravida Para Term Preterm AB Living   6 3           SAB TAB Ectopic Multiple Live Births                   Home Medications    Prior to Admission medications   Medication Sig Start Date End Date Taking? Authorizing Provider  NIFEDIPINE PO Take by mouth.    [provider]  nitrofurantoin, macrocrystal-monohydrate, (MACROBID) 100 MG capsule Take 1 capsule (100 mg total) by mouth 2 (two) times daily. 06/06/16 06/11/16  Jya Hughston, PA-C    Family History History reviewed. No pertinent family history.  Social History Social History  Substance Use Topics  . Smoking status: Current Every Day Smoker    Packs/day: 0.50    Types: Cigarettes  . Smokeless tobacco: Never Used  . Alcohol use No     Allergies   Benadryl [diphenhydramine hcl]   Review of Systems Review of Systems  Constitutional: Negative for chills and fever.  Respiratory: Negative for shortness of breath.   Cardiovascular: Negative for chest pain and palpitations.  Gastrointestinal: Negative for nausea and vomiting.    Genitourinary: Positive for dysuria, frequency, urgency and vaginal discharge. Negative for flank pain.     Physical Exam Updated Vital Signs BP 120/66 (BP Location: Right Arm)   Pulse (!) 107   Temp 98.8 F (37.1 C) (Oral)   Resp 18   Wt 56.4 kg (124 lb 6 oz)   LMP 12/10/2015   SpO2 100%   BMI 25.99 kg/m   Physical Exam  Constitutional: She appears well-developed and well-nourished. No distress.  HENT:  Head: Normocephalic and atraumatic.  Eyes: EOM are normal.  Neck: Normal range of motion.  Cardiovascular: Normal rate, regular rhythm, normal heart sounds and intact distal pulses.   Pulmonary/Chest: Effort normal and breath sounds normal.  Skin: Skin is warm and dry.     ED Treatments / Results  Labs (all labs ordered are listed, but only abnormal results are displayed) Labs Reviewed  URINALYSIS, ROUTINE W REFLEX MICROSCOPIC - Abnormal; Notable for the following:       Result Value   Color, Urine AMBER (*)    APPearance TURBID (*)    Bilirubin Urine SMALL (*)    Protein, ur 30 (*)    Nitrite POSITIVE (*)    Leukocytes, UA MODERATE (*)    All other components within normal limits  URINALYSIS, MICROSCOPIC (REFLEX) - Abnormal; Notable for the following:  Bacteria, UA MANY (*)    Squamous Epithelial / LPF 6-30 (*)    All other components within normal limits  URINE CULTURE    EKG  EKG Interpretation None       Radiology No results found.  Procedures Procedures (including critical care time)  Medications Ordered in ED Medications - No data to display   Initial Impression / Assessment and Plan / ED Course  I have reviewed the triage vital signs and the nursing notes.  Pertinent labs & imaging results that were available during my care of the patient were reviewed by me and considered in my medical decision making (see chart for details).     Pt with a h/o frequent UTIs present with dysuria, urinary frequency and vaginal discharge. UA positive  for UTI with positive nitrites, positive leuks, and many bacteria. Will d/c pt with macrobid  Discussed plan of care with pt. Pt is to return for f/u if sxs worsen, or if fever/chills.   Final Clinical Impressions(s) / ED Diagnoses   Final diagnoses:  Acute cystitis without hematuria  [redacted] weeks gestation of pregnancy    New Prescriptions New Prescriptions   NITROFURANTOIN, MACROCRYSTAL-MONOHYDRATE, (MACROBID) 100 MG CAPSULE    Take 1 capsule (100 mg total) by mouth 2 (two) times daily.     Alveria Apley, PA-C 06/06/16 1538    Mackuen, Cindee Salt, MD 06/06/16 [phone]

## 2016-06-08 LAB — URINE CULTURE

## 2016-07-29 IMAGING — US US TRANSVAGINAL NON-OB
1 series · 13 of 25 positions shown · non-contrast
Comparison: None

CLINICAL DATA: Pelvic pain and vaginal bleeding. Midline pelvic
pain and cramping. Heavy vaginal bleeding for days.

EXAM:
TRANSABDOMINAL AND TRANSVAGINAL ULTRASOUND OF PELVIS
TECHNIQUE: Both transabdominal and transvaginal ultrasound examinations of the
pelvis were performed. Transabdominal technique was performed for
global imaging of the pelvis including uterus, ovaries, adnexal
regions, and pelvic cul-de-sac. It was necessary to proceed with
endovaginal exam following the transabdominal exam to visualize the
uterus, endometrium and ovaries.

[Series 1: us transvaginal non-ob · 0.18mm/px · 13 of 81 slices shown]
[im 1/81]
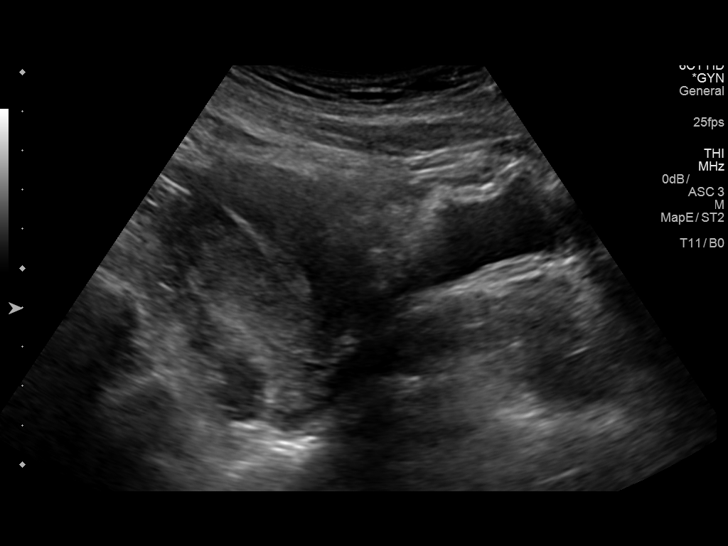
[im 7/81]
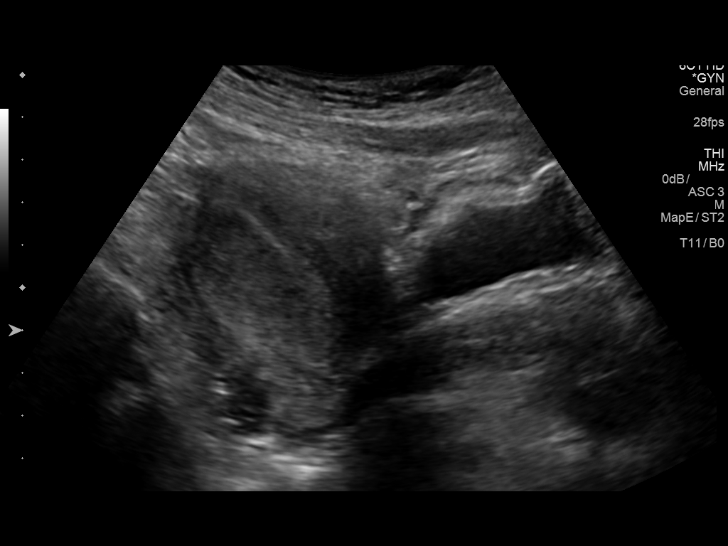
[im 14/81]
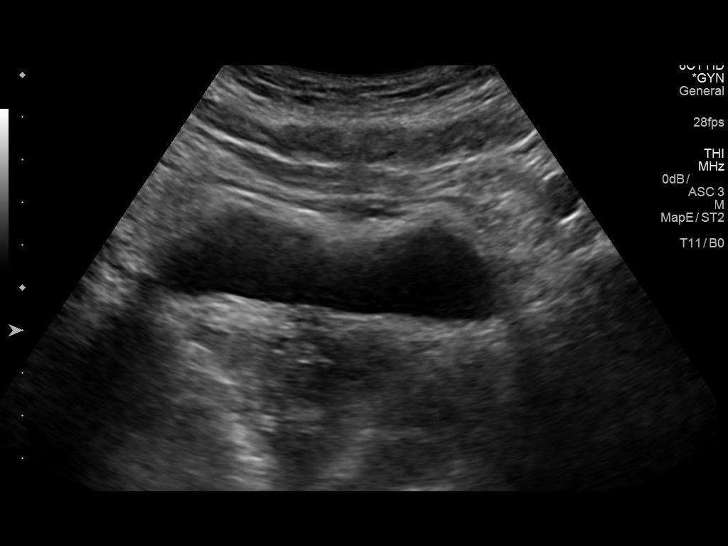
[im 21/81]
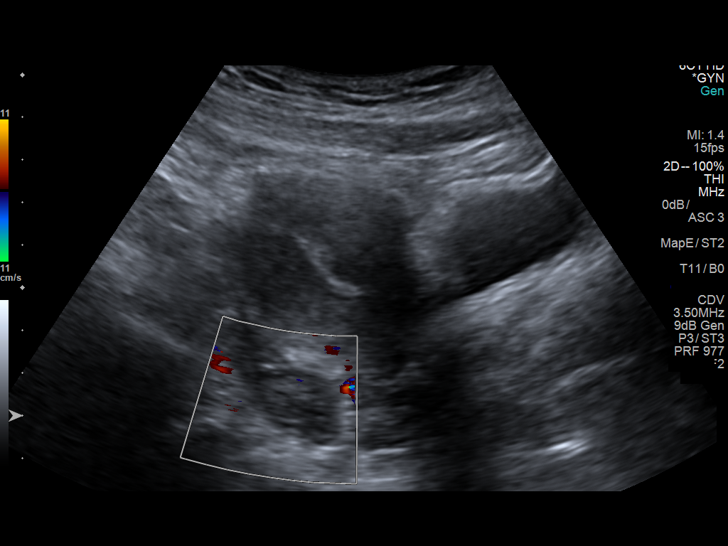
[im 27/81]
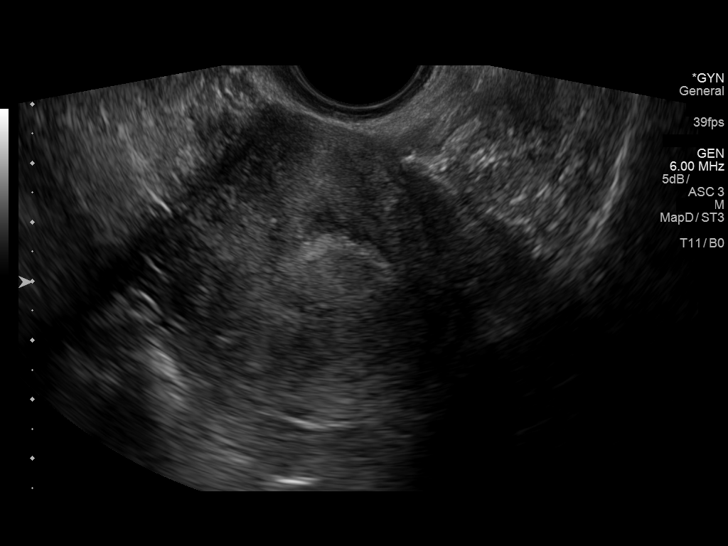
[im 34/81]
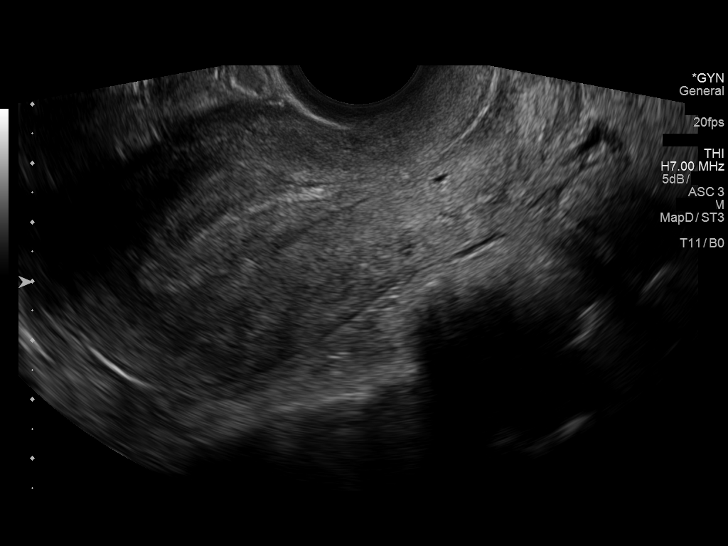
[im 41/81]
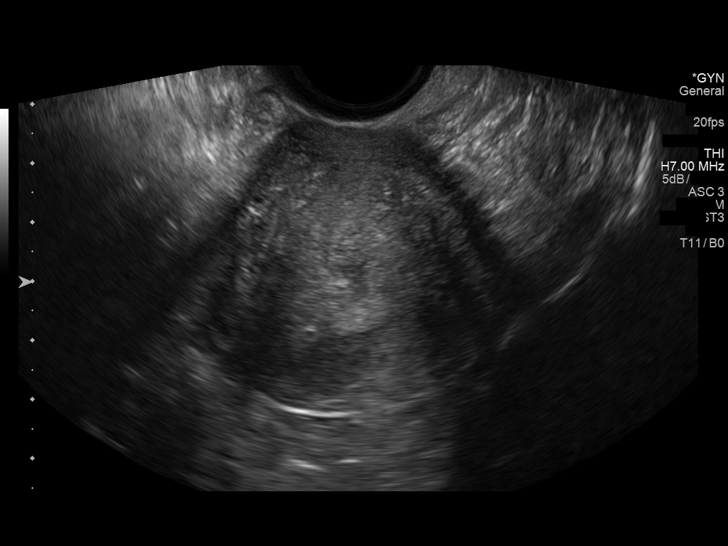
[im 47/81]
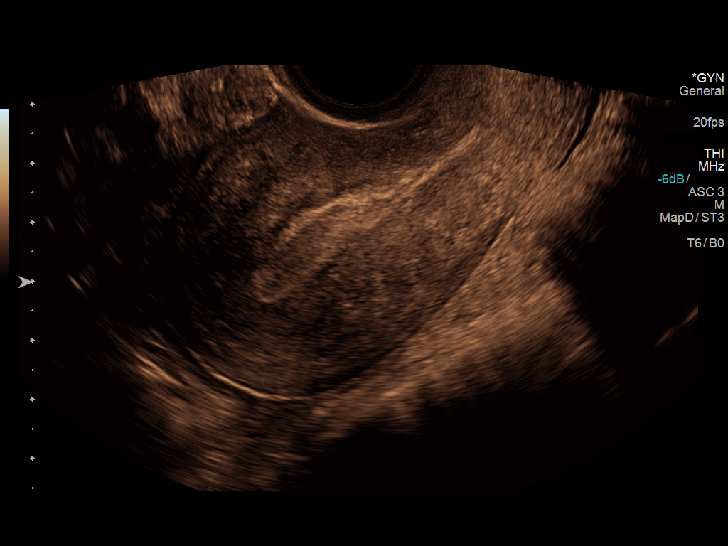
[im 54/81]
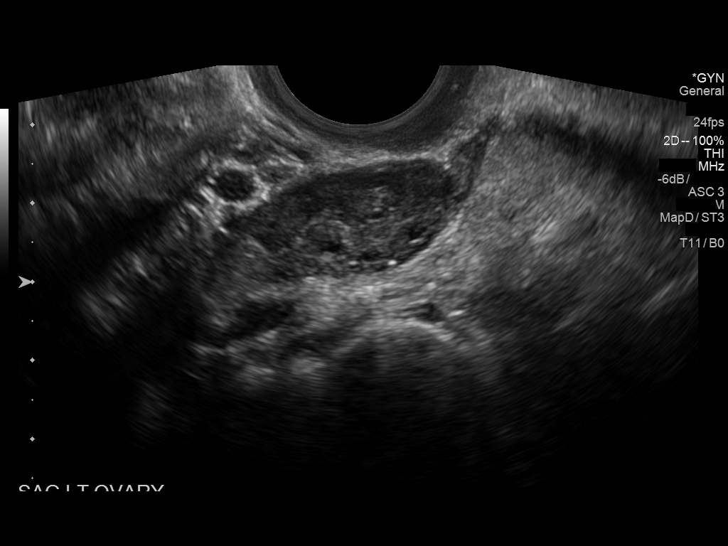
[im 61/81]
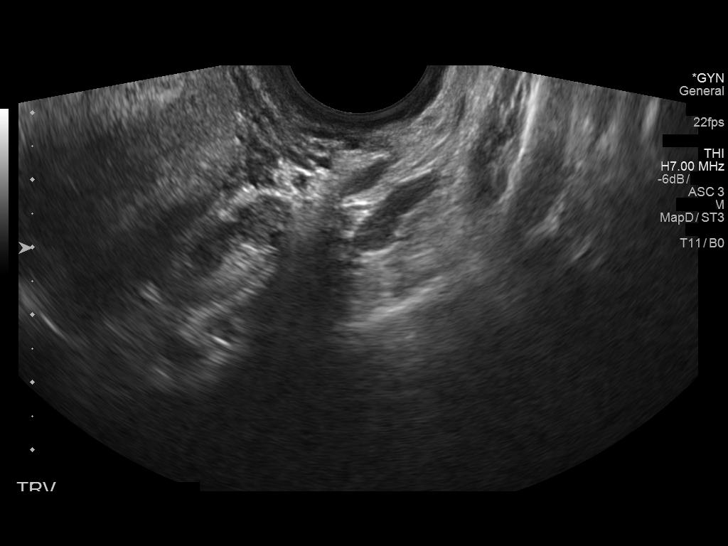
[im 67/81]
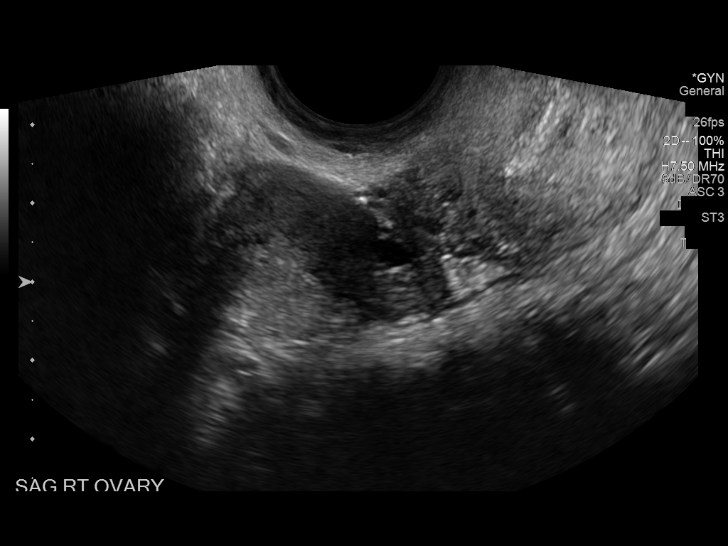
[im 74/81]
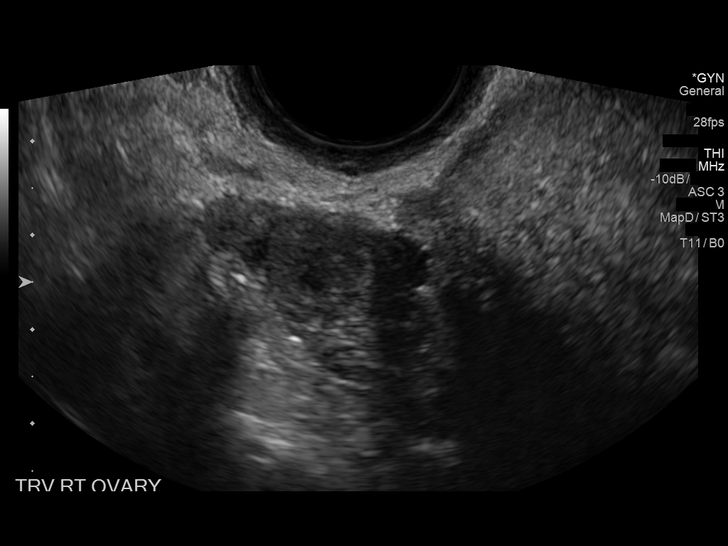
[im 81/81]
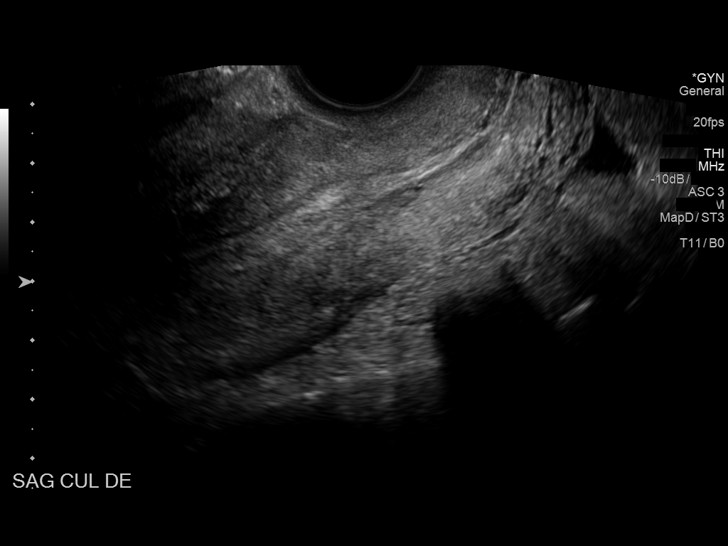

[13 of 25 positions shown; findings below may reference images not displayed]

FINDINGS: Uterus

Measurements: 9.5 x 4.6 x 5.6 cm. Myometrial echotexture is
heterogeneous. No discrete fibroids or other mass visualized.

Endometrium

Thickness: 1.4 cm. Complex fluid in the endometrial canal. No focal
abnormality visualized.

Right ovary

Measurements: 3.4 x 1.6 x 1.7 cm. There is a probable collapsing
corpus luteal cyst. Normal blood flow seen. No suspicious mass.

Left ovary

Measurements: 3.6 x 1.4 x 1.5 cm. Normal appearance/no adnexal mass.
Normal blood flow seen.

Other findings

Trace free fluid.
IMPRESSION: 1. Complex fluid in the endometrial canal which is normal in
thickness. No focal endometrial abnormality.
2. Heterogeneous echogenicity of the myometrium without focal
fibroid.
3. Normal appearance of both ovaries. Probable collapsing corpus
luteal cyst on the right.

## 2016-12-13 IMAGING — US US OB TRANSVAGINAL
1 series · 14 of 28 positions shown · non-contrast
Comparison: None.

CLINICAL DATA: Pregnant patient with cramping in early pregnancy.

EXAM:
OBSTETRIC <14 WK US AND TRANSVAGINAL OB US
TECHNIQUE: Both transabdominal and transvaginal ultrasound examinations were
performed for complete evaluation of the gestation as well as the
maternal uterus, adnexal regions, and pelvic cul-de-sac.
Transvaginal technique was performed to assess early pregnancy.

[Series 1: us ob transvaginal · 0.09mm/px · 30 acquisitions, 14 frames shown]
[im 2/30]
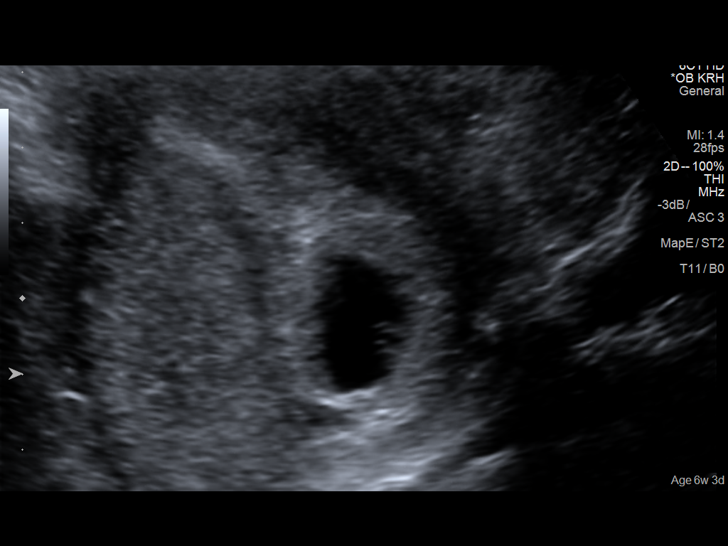
[im 4/30]
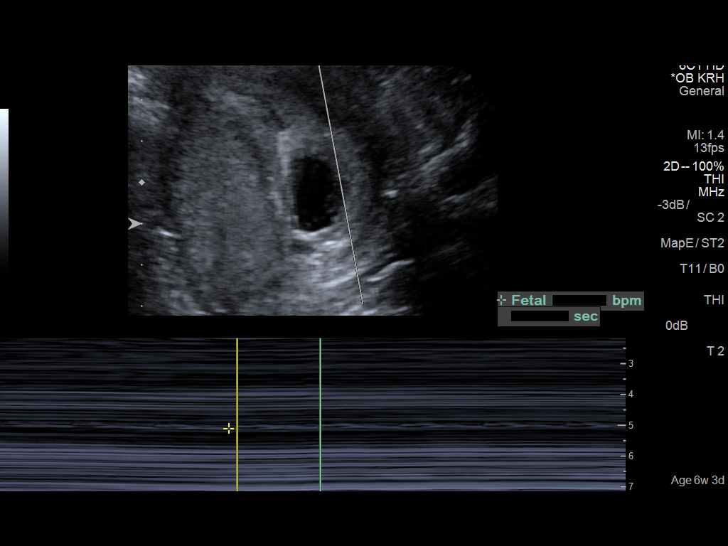
[im 6/30]
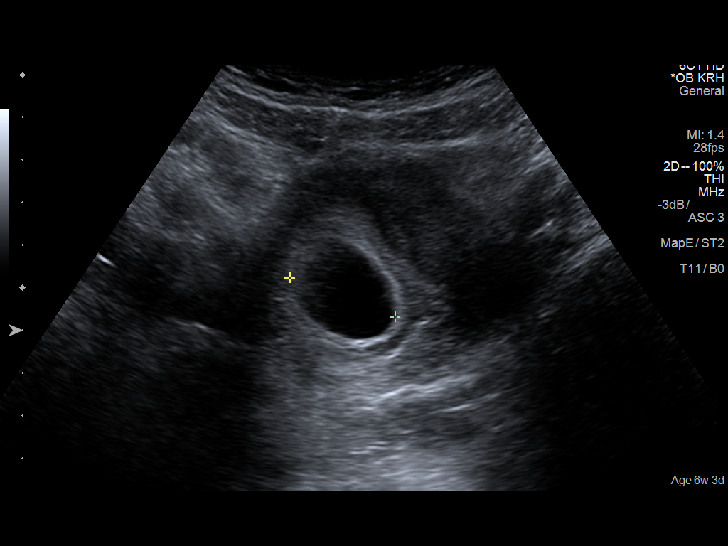
[im 8/30]
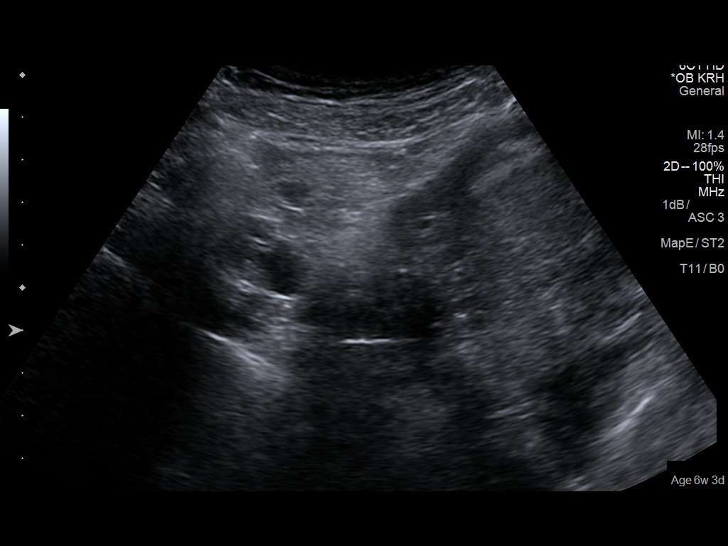
[im 10/30]
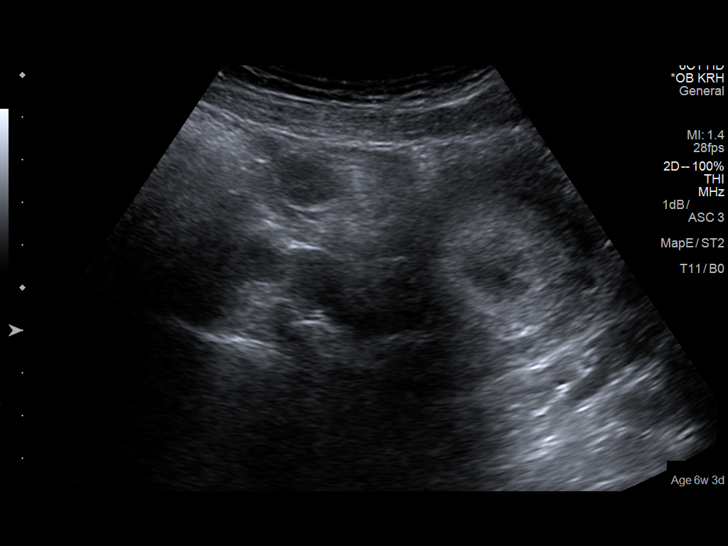
[im 12/30]
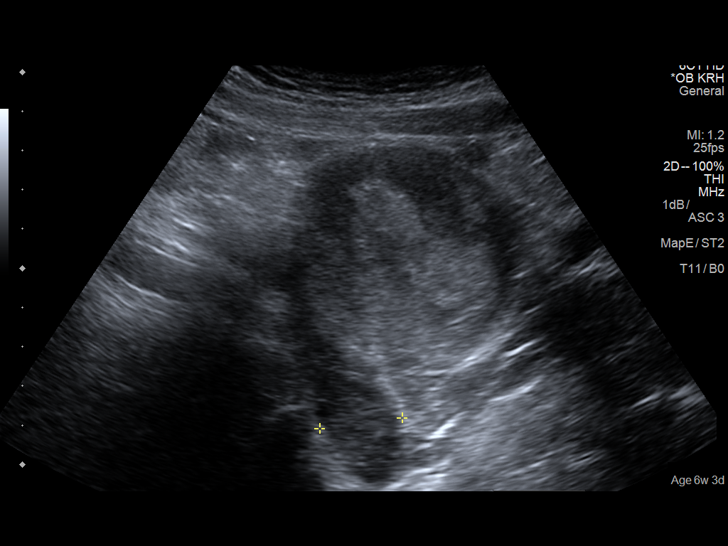
[im 14/30]
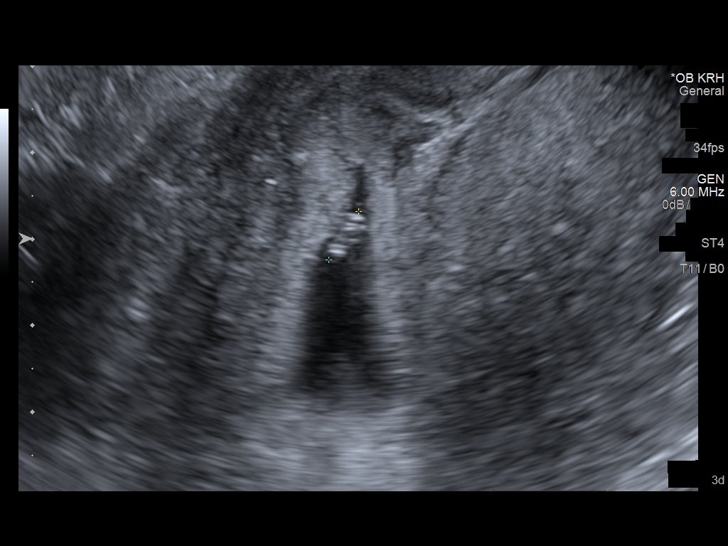
[im 17/30]
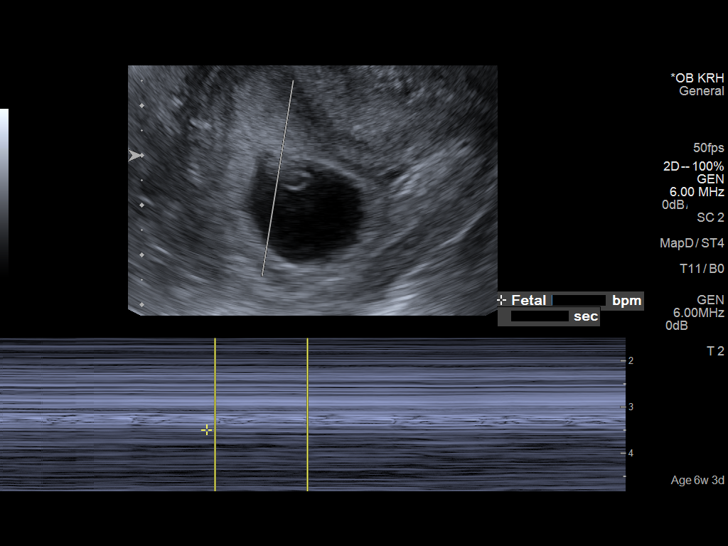
[im 19/30]
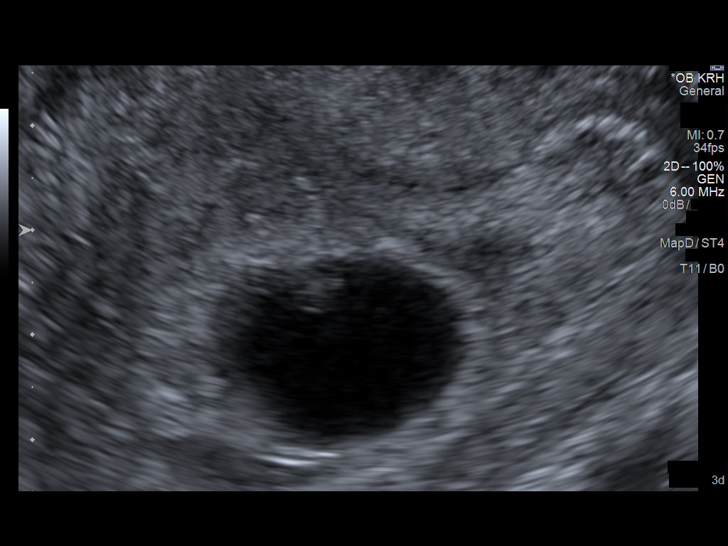
[im 21/30]
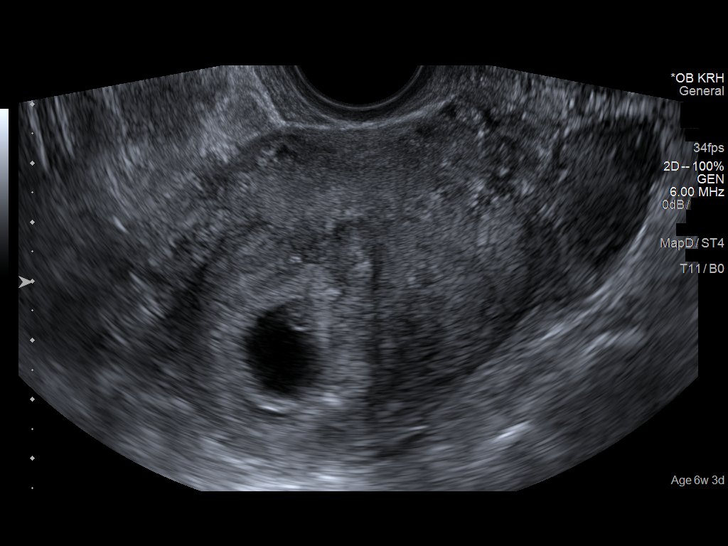
[im 23/30]
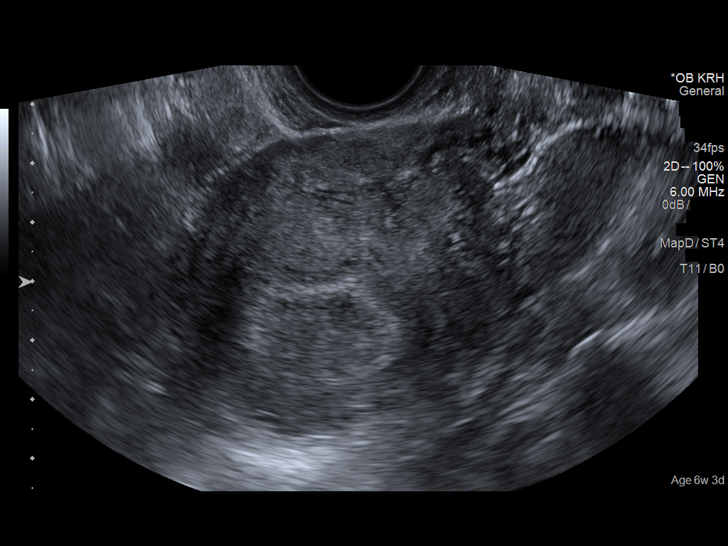
[im 25/30]
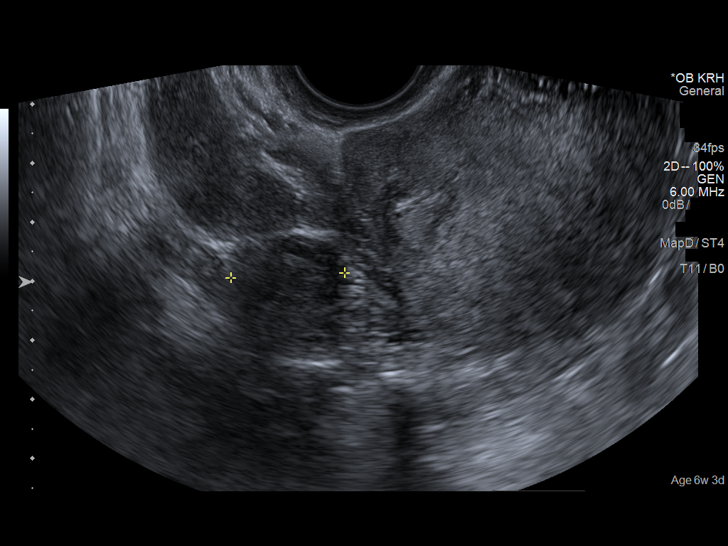
[im 27/30]
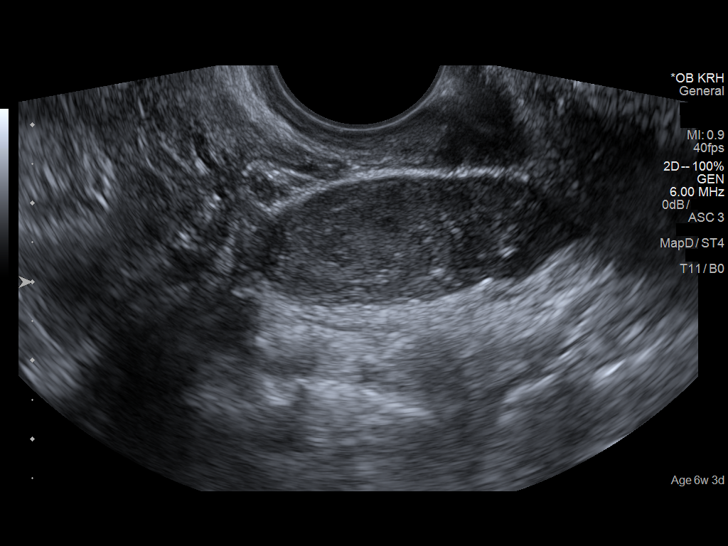
[im 30/30]
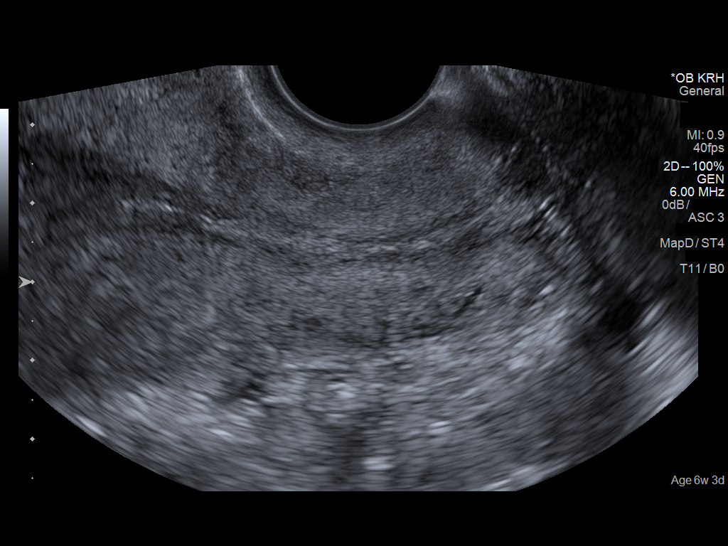

[14 of 28 positions shown; findings below may reference images not displayed]

FINDINGS: Intrauterine gestational sac: Visualized/normal in shape.

Yolk sac:  Present.

Embryo:  Present

Cardiac Activity: Present.

Heart Rate: 123  bpm

CRL:  8  mm   6 w   6 d                  US EDC: 09/14/2015

Subchorionic hemorrhage:  None visualized.

Maternal uterus/adnexae: Right ovary is normal. Probable corpus
luteal cyst in the left ovary. No adnexal mass or pelvic free fluid.
IMPRESSION: Single live intrauterine pregnancy estimated gestational age 6 weeks
6 days for estimated date of delivery 09/14/2015. No subchorionic
hemorrhage or complication.

## 2017-04-04 ENCOUNTER — Emergency Department (HOSPITAL_BASED_OUTPATIENT_CLINIC_OR_DEPARTMENT_OTHER): Payer: Medicaid Other

## 2017-04-04 ENCOUNTER — Emergency Department (HOSPITAL_BASED_OUTPATIENT_CLINIC_OR_DEPARTMENT_OTHER)
Admission: EM | Admit: 2017-04-04 | Discharge: 2017-04-05 | Disposition: A | Discharge status: Home / Self Care | Payer: Medicaid Other | Attending: Emergency Medicine | Admitting: Emergency Medicine

## 2017-04-04 ENCOUNTER — Other Ambulatory Visit: Payer: Self-pay

## 2017-04-04 ENCOUNTER — Encounter (HOSPITAL_BASED_OUTPATIENT_CLINIC_OR_DEPARTMENT_OTHER): Payer: Self-pay | Admitting: *Deleted

## 2017-04-04 DIAGNOSIS — N898 Other specified noninflammatory disorders of vagina: Secondary | ICD-10-CM | POA: Diagnosis not present

## 2017-04-04 DIAGNOSIS — O2342 Unspecified infection of urinary tract in pregnancy, second trimester: Secondary | ICD-10-CM | POA: Insufficient documentation

## 2017-04-04 DIAGNOSIS — E079 Disorder of thyroid, unspecified: Secondary | ICD-10-CM | POA: Insufficient documentation

## 2017-04-04 DIAGNOSIS — O208 Other hemorrhage in early pregnancy: Secondary | ICD-10-CM | POA: Insufficient documentation

## 2017-04-04 DIAGNOSIS — N760 Acute vaginitis: Secondary | ICD-10-CM

## 2017-04-04 DIAGNOSIS — O209 Hemorrhage in early pregnancy, unspecified: Secondary | ICD-10-CM

## 2017-04-04 DIAGNOSIS — O200 Threatened abortion: Secondary | ICD-10-CM | POA: Diagnosis not present

## 2017-04-04 DIAGNOSIS — F17210 Nicotine dependence, cigarettes, uncomplicated: Secondary | ICD-10-CM | POA: Insufficient documentation

## 2017-04-04 DIAGNOSIS — Z3A15 15 weeks gestation of pregnancy: Secondary | ICD-10-CM

## 2017-04-04 DIAGNOSIS — O99332 Smoking (tobacco) complicating pregnancy, second trimester: Secondary | ICD-10-CM | POA: Diagnosis not present

## 2017-04-04 DIAGNOSIS — O9989 Other specified diseases and conditions complicating pregnancy, childbirth and the puerperium: Secondary | ICD-10-CM | POA: Diagnosis present

## 2017-04-04 DIAGNOSIS — R102 Pelvic and perineal pain: Secondary | ICD-10-CM

## 2017-04-04 DIAGNOSIS — I10 Essential (primary) hypertension: Secondary | ICD-10-CM | POA: Insufficient documentation

## 2017-04-04 DIAGNOSIS — M545 Low back pain, unspecified: Secondary | ICD-10-CM

## 2017-04-04 DIAGNOSIS — B9689 Other specified bacterial agents as the cause of diseases classified elsewhere: Secondary | ICD-10-CM

## 2017-04-04 LAB — CBC WITH DIFFERENTIAL/PLATELET
BASOS ABS: 0 10*3/uL (ref 0.0–0.1)
BASOS PCT: 0 %
EOS ABS: 0.1 10*3/uL (ref 0.0–0.7)
Eosinophils Relative: 1 %
HCT: 30.8 % — ABNORMAL LOW (ref 36.0–46.0)
Hemoglobin: 10.3 g/dL — ABNORMAL LOW (ref 12.0–15.0)
Lymphocytes Relative: 31 %
Lymphs Abs: 4.3 10*3/uL — ABNORMAL HIGH (ref 0.7–4.0)
MCH: 25.8 pg — ABNORMAL LOW (ref 26.0–34.0)
MCHC: 33.4 g/dL (ref 30.0–36.0)
MCV: 77 fL — ABNORMAL LOW (ref 78.0–100.0)
Monocytes Absolute: 0.7 10*3/uL (ref 0.1–1.0)
Monocytes Relative: 5 %
Neutro Abs: 8.7 10*3/uL — ABNORMAL HIGH (ref 1.7–7.7)
Neutrophils Relative %: 63 %
Platelets: 289 10*3/uL (ref 150–400)
RBC: 4 MIL/uL (ref 3.87–5.11)
RDW: 20.1 % — ABNORMAL HIGH (ref 11.5–15.5)
WBC: 13.8 10*3/uL — AB (ref 4.0–10.5)

## 2017-04-04 LAB — URINALYSIS, ROUTINE W REFLEX MICROSCOPIC
Bilirubin Urine: NEGATIVE
GLUCOSE, UA: NEGATIVE mg/dL
Hgb urine dipstick: NEGATIVE
KETONES UR: 15 mg/dL — AB
Leukocytes, UA: NEGATIVE
Nitrite: NEGATIVE
PH: 6 (ref 5.0–8.0)
Protein, ur: 30 mg/dL — AB
Specific Gravity, Urine: >1.03 — ABNORMAL HIGH (ref 1.005–1.030)

## 2017-04-04 LAB — COMPREHENSIVE METABOLIC PANEL
ALT: 8 U/L — AB (ref 14–54)
AST: 11 U/L — AB (ref 15–41)
Albumin: 3.3 g/dL — ABNORMAL LOW (ref 3.5–5.0)
Alkaline Phosphatase: 82 U/L (ref 38–126)
Anion gap: 10 (ref 5–15)
BUN: 9 mg/dL (ref 6–20)
CO2: 18 mmol/L — AB (ref 22–32)
CREATININE: 0.46 mg/dL (ref 0.44–1.00)
Calcium: 8.4 mg/dL — ABNORMAL LOW (ref 8.9–10.3)
Chloride: 108 mmol/L (ref 101–111)
GFR calc Af Amer: >60 mL/min (ref >60)
GFR calc non Af Amer: >60 mL/min (ref >60)
Glucose, Bld: 86 mg/dL (ref 65–99)
Potassium: 3.4 mmol/L — ABNORMAL LOW (ref 3.5–5.1)
SODIUM: 136 mmol/L (ref 135–145)
Total Bilirubin: <0.1 mg/dL — ABNORMAL LOW (ref 0.3–1.2)
Total Protein: 6.8 g/dL (ref 6.5–8.1)

## 2017-04-04 LAB — URINALYSIS, MICROSCOPIC (REFLEX)

## 2017-04-04 LAB — WET PREP, GENITAL
Sperm: NONE SEEN
TRICH WET PREP: NONE SEEN
Yeast Wet Prep HPF POC: NONE SEEN

## 2017-04-04 LAB — PREGNANCY, URINE: Preg Test, Ur: POSITIVE — AB

## 2017-04-04 LAB — HCG, QUANTITATIVE, PREGNANCY: HCG, BETA CHAIN, QUANT, S: 20697 m[IU]/mL — AB (ref <5)

## 2017-04-04 LAB — LIPASE, BLOOD: Lipase: 29 U/L (ref 11–51)

## 2017-04-04 MED ORDER — SODIUM CHLORIDE 0.9 % IV BOLUS (SEPSIS)
1000.0000 mL | Freq: Once | INTRAVENOUS | Status: AC
  Administered 2017-04-04: 1000 mL via INTRAVENOUS

## 2017-04-04 MED ORDER — VITAMIN B-6 25 MG PO TABS: 25.0000 mg | ORAL_TABLET | Freq: Three times a day (TID) | ORAL | 0 refills | Status: AC | PRN

## 2017-04-04 MED ORDER — METRONIDAZOLE 500 MG PO TABS
500.0000 mg | ORAL_TABLET | Freq: Once | ORAL | Status: AC
  Administered 2017-04-04: 500 mg via ORAL
  Filled 2017-04-04: qty 1

## 2017-04-04 MED ORDER — METRONIDAZOLE 500 MG PO TABS: 500.0000 mg | ORAL_TABLET | Freq: Two times a day (BID) | ORAL | 0 refills | Status: DC

## 2017-04-04 NOTE — ED Provider Notes (Signed)
[facility] EMERGENCY DEPARTMENT Provider Note   CSN: [id number] Arrival date & time: 04/04/17  1945     History   Chief Complaint Chief Complaint  Patient presents with  . Back Pain  . Dysuria    HPI Jaime Dunlap is a 37 y.o. female.  The history is provided by medical records and the patient. No language interpreter was used.  Vaginal Bleeding  Primary symptoms include discharge, dysuria, and vaginal bleeding.  Primary symptoms include no pelvic pain. There has been no fever. This is a new problem. The current episode started 2 days ago. The problem occurs constantly. The problem has not changed since onset.Her LMP was months ago. The discharge was white. Associated symptoms include abdominal pain and constipation. Pertinent negatives include no diaphoresis, no diarrhea, no nausea, no vomiting, no frequency, no light-headedness and no dizziness. She has tried nothing for the symptoms. The treatment provided no relief. Associated medical issues include vaginosis. Associated medical issues do not include ectopic pregnancy.    Past Medical History:  Diagnosis Date  . Hypertension   . Migraine   . Thyroid disease     There are no active problems to display for this patient.   History reviewed. No pertinent surgical history.  OB History    Gravida  6   Para  3   Term      Preterm      AB      Living        SAB      TAB      Ectopic      Multiple      Live Births               Home Medications    Prior to Admission medications   Medication Sig Start Date End Date Taking? Authorizing Provider  NIFEDIPINE PO Take by mouth.    [provider]  promethazine (PHENERGAN) 25 MG tablet Take 1 tablet (25 mg total) by mouth every 6 (six) hours as needed for nausea. 01/30/12 09/11/14  Nelva NayBeaton, Robert, MD    Family History No family history on file.  Social History Social History   Tobacco Use  . Smoking status: Current Every Day  Smoker    Packs/day: 0.50    Types: Cigarettes  . Smokeless tobacco: Never Used  Substance Use Topics  . Alcohol use: No  . Drug use: No     Allergies   Benadryl [diphenhydramine hcl]   Review of Systems Review of Systems  Constitutional: Negative for chills, diaphoresis, fatigue and fever.  HENT: Negative for congestion and rhinorrhea.   Respiratory: Negative for cough, chest tightness, shortness of breath, wheezing and stridor.   Cardiovascular: Negative for chest pain and palpitations.  Gastrointestinal: Positive for abdominal pain and constipation. Negative for diarrhea, nausea and vomiting.  Genitourinary: Positive for dysuria, vaginal bleeding and vaginal discharge. Negative for flank pain, frequency and pelvic pain.  Musculoskeletal: Positive for back pain. Negative for neck pain and neck stiffness.  Skin: Negative for rash and wound.  Neurological: Negative for dizziness, light-headedness and headaches.  Psychiatric/Behavioral: Negative for agitation.  All other systems reviewed and are negative.    Physical Exam Updated Vital Signs BP (!) 149/80   Pulse (!) 101   Temp 98.5 F (36.9 C) (Oral)   Resp 20   Ht 4\' 10"  (1.473 m)   Wt 55.9 kg (123 lb 4 oz)   LMP 01/15/2017   SpO2 100%   BMI  25.76 kg/m   Physical Exam  Constitutional: She is oriented to person, place, and time. She appears well-developed and well-nourished. No distress.  HENT:  Head: Normocephalic.  Nose: Nose normal.  Mouth/Throat: Oropharynx is clear and moist. No oropharyngeal exudate.  Eyes: Pupils are equal, round, and reactive to light.  Neck: Normal range of motion.  Cardiovascular: Normal rate and intact distal pulses.  No murmur heard. Pulmonary/Chest: Effort normal. No respiratory distress. She has no wheezes. She has no rales. She exhibits no tenderness.  Abdominal: Soft. Bowel sounds are normal. She exhibits no distension. There is no tenderness.  Genitourinary: Uterus is not  tender. Cervix exhibits discharge. Cervix exhibits no motion tenderness and no friability. Right adnexum displays no mass, no tenderness and no fullness. Left adnexum displays no mass, no tenderness and no fullness. No tenderness or bleeding in the vagina. Vaginal discharge found.  Musculoskeletal: She exhibits tenderness.       Lumbar back: She exhibits tenderness and pain.       Back:  Neurological: She is alert and oriented to person, place, and time. No sensory deficit. She exhibits normal muscle tone.  Skin: Capillary refill takes less than 2 seconds. No rash noted. She is not diaphoretic. No erythema.  Psychiatric: She has a normal mood and affect.  Nursing note and vitals reviewed.    ED Treatments / Results  Labs (all labs ordered are listed, but only abnormal results are displayed) Labs Reviewed  WET PREP, GENITAL - Abnormal; Notable for the following components:      Result Value   Clue Cells Wet Prep HPF POC PRESENT (*)    WBC, Wet Prep HPF POC MODERATE (*)    All other components within normal limits  URINALYSIS, ROUTINE W REFLEX MICROSCOPIC - Abnormal; Notable for the following components:   APPearance HAZY (*)    Specific Gravity, Urine >1.030 (*)    Ketones, ur 15 (*)    Protein, ur 30 (*)    All other components within normal limits  PREGNANCY, URINE - Abnormal; Notable for the following components:   Preg Test, Ur POSITIVE (*)    All other components within normal limits  URINALYSIS, MICROSCOPIC (REFLEX) - Abnormal; Notable for the following components:   Bacteria, UA FEW (*)    Squamous Epithelial / LPF 6-30 (*)    All other components within normal limits  CBC WITH DIFFERENTIAL/PLATELET - Abnormal; Notable for the following components:   WBC 13.8 (*)    Hemoglobin 10.3 (*)    HCT 30.8 (*)    MCV 77.0 (*)    MCH 25.8 (*)    RDW 20.1 (*)    Neutro Abs 8.7 (*)    Lymphs Abs 4.3 (*)    All other components within normal limits  COMPREHENSIVE METABOLIC PANEL  - Abnormal; Notable for the following components:   Potassium 3.4 (*)    CO2 18 (*)    Calcium 8.4 (*)    Albumin 3.3 (*)    AST 11 (*)    ALT 8 (*)    Total Bilirubin <0.1 (*)    All other components within normal limits  HCG, QUANTITATIVE, PREGNANCY - Abnormal; Notable for the following components:   hCG, Beta Chain, Quant, S 20,697 (*)    All other components within normal limits  LIPASE, BLOOD  WET PREP  (BD AFFIRM) ([place])  GC/CHLAMYDIA PROBE AMP ([place]) NOT AT [facility]    EKG  EKG Interpretation None  Radiology US Ob Limited  Result Date: 04/04/2017 CLINICAL DATA:  Right adnexal pain x2 days. EXAM: LIMITED OBSTETRIC ULTRASOUND FINDINGS: Number of Fetuses: 1 Heart Rate:  147 bpm Movement: Yes Presentation: Breech Placental Location: Lateral Right Previa: No Amniotic Fluid (Subjective):  Within normal limits. BPD: 2.95 cm 15 w  3 d MATERNAL FINDINGS: Cervix:  Appears closed. Uterus/Adnexae: No abnormality visualized. The right ovary is not enlarged and estimated at 3.5 x 2.3 x 1.8 cm. No mass is noted. IMPRESSION: Viable intrauterine gestation at 15 weeks 3 days. No findings for patient's right adnexal pain. This exam is performed on an emergent basis and does not comprehensively evaluate fetal size, dating, or anatomy; follow-up complete OB US should be considered if further fetal assessment is warranted. Electronically Signed   By: Tollie Eth M.D.   On: 04/04/2017 22:54    Procedures Procedures (including critical care time)  Medications Ordered in ED Medications  sodium chloride 0.9 % bolus 1,000 mL (0 mLs Intravenous Stopped 04/04/17 2338)  metroNIDAZOLE (FLAGYL) tablet 500 mg (500 mg Oral Given 04/04/17 2336)     Initial Impression / Assessment and Plan / ED Course  I have reviewed the triage vital signs and the nursing notes.  Pertinent labs & imaging results that were available during my care of the patient were reviewed by me and considered in my  medical decision making (see chart for details).     Lajuana Cipriani is a 38 y.o. female with a past medical history significant for hypertension and thyroid disease who presents with positive pregnancy test, intermittent vaginal bleeding, vaginal discharge, mild abdominal aching, and flank pain.  Patient reports that she has 5 children and her youngest child is 19 months old.  She says that her last menstrual cycle was in December but she had a positive pregnancy test last month.  She reports that she has not started prenatal vitamins but began having some abdominal cramping in her lower abdomen and some right-sided flank pain today.  She reports that she had some vaginal bleeding spotting yesterday but that has resolved.  She denies any vaginal bleeding.  She does report very mild vaginal discharge.  She reports some possible dysuria but reports no dysuria at this time.  She does report some constipation but denies diarrhea.  She denies any other complaints on arrival.  On exam, patient's lungs are clear.  Chest is nontender.  Abdomen is nontender.  Right flank is slightly tender to palpation in the muscular distribution.  Pelvic exam was performed with a white discharge.  No cervical motion tenderness or adnexal tenderness.  Exam otherwise unremarkable.  Pregnancy test was positive.  HCG was ordered.  Wet prep revealed positive clue cells, patient will be treated for bacterial vaginosis in the setting of symptoms and pregnancy.  Metabolic panel was overall reassuring but CBC did show mild leukocytosis.  Urinalysis showed no nitrites or leukocytes and evidence of contamination.  Do not feel patient has UTI or pyelonephritis.  Ultrasound was ordered revealing an intrauterine pregnancy at 15 weeks and 3 days.  No evidence of abnormality seen.  No ectopic pregnancy seen.  Given the patient's intermittent vaginal bleeding yesterday, patient will be classified as a threatened abortion.  Patient will need to  follow-up with OB/GYN given her workup results.  Do not feel patient has pelvic inflammatory disease or other infection requiring further management or workup tonight.  Chart review showed patient had a positive Rh factor, do not feel she needs RhoGam  given the bleeding.  Patient reports she is been having some nausea when in the car during this early pregnancy.  Patient given prescription for vitamin B6.  Patient was given the prescription for Flagyl for the BV treatment.  Patient also encouraged to start prenatal vitamins and follow-up with OB/GYN for prenatal care.  Patient voiced understanding of return precautions and plan of care.  Patient discharged in good condition.   Final Clinical Impressions(s) / ED Diagnoses   Final diagnoses:  [redacted] weeks gestation of pregnancy  Threatened abortion  Low back pain without sciatica, unspecified back pain laterality, unspecified chronicity  Bacterial vaginosis    ED Discharge Orders        Ordered    metroNIDAZOLE (FLAGYL) 500 MG tablet  2 times daily     04/04/17 2332    vitamin B-6 (PYRIDOXINE) 25 MG tablet  Every 8 hours PRN     04/04/17 2334      Clinical Impression: 1. Threatened abortion   2. Vaginal bleeding affecting early pregnancy   3. Right adnexal tenderness   4. [redacted] weeks gestation of pregnancy   5. Low back pain without sciatica, unspecified back pain laterality, unspecified chronicity   6. Bacterial vaginosis     Disposition: Discharge  Condition: Good  I have discussed the results, Dx and Tx plan with the pt(& family if present). He/she/they expressed understanding and agree(s) with the plan. Discharge instructions discussed at great length. Strict return precautions discussed and pt &/or family have verbalized understanding of the instructions. No further questions at time of discharge.    Discharge Medication List as of 04/04/2017 11:39 PM    START taking these medications   Details  metroNIDAZOLE (FLAGYL) 500 MG  tablet Take 1 tablet (500 mg total) by mouth 2 (two) times daily., Starting Thu 04/04/2017, Print    vitamin B-6 (PYRIDOXINE) 25 MG tablet Take 1 tablet (25 mg total) by mouth every 8 (eight) hours as needed., Starting Thu 04/04/2017, Print        Follow Up: [facility] [address] [place] [address] [phone] Schedule an appointment as soon as possible for a visit    [facility] [place] [facility]Y DEPARTMENT [address] [id number] [address] [place] [address] [phone]       Tegeler, Canary Brim, MD 04/05/17 [phone]

## 2017-04-04 NOTE — ED Triage Notes (Signed)
Back pain and dysuria today. She had a positive pregnancy test at home in February.

## 2017-04-04 NOTE — Discharge Instructions (Addendum)
Your workup today revealed a pregnancy at 15 weeks and 3 days.  Given the vaginal bleeding you had yesterday, we are concerned you may be having a threatened miscarriage however your pelvic exam today was reassuring.  We did find that she had bacterial vaginosis, please take the antibiotics to treat this.  We did not find evidence of urinary tract infection or kidney infection.  Please use the vitamin to help with your nausea as I suspect is related to your pregnancy.  Please follow-up with your OB/GYN team for further management.  If any symptoms change or worsen, please return to the nearest emergency department.

## 2017-04-08 LAB — GC/CHLAMYDIA PROBE AMP (~~LOC~~) NOT AT ARMC
CHLAMYDIA, DNA PROBE: NEGATIVE
Neisseria Gonorrhea: NEGATIVE

## 2017-04-29 ENCOUNTER — Encounter (HOSPITAL_BASED_OUTPATIENT_CLINIC_OR_DEPARTMENT_OTHER): Payer: Self-pay | Admitting: Emergency Medicine

## 2017-04-29 ENCOUNTER — Emergency Department (HOSPITAL_BASED_OUTPATIENT_CLINIC_OR_DEPARTMENT_OTHER)
Admission: EM | Admit: 2017-04-29 | Discharge: 2017-04-30 | Disposition: A | Discharge status: Home / Self Care | Payer: Medicaid Other | Attending: Emergency Medicine | Admitting: Emergency Medicine

## 2017-04-29 ENCOUNTER — Other Ambulatory Visit: Payer: Self-pay

## 2017-04-29 DIAGNOSIS — O10012 Pre-existing essential hypertension complicating pregnancy, second trimester: Secondary | ICD-10-CM | POA: Diagnosis not present

## 2017-04-29 DIAGNOSIS — Z3A19 19 weeks gestation of pregnancy: Secondary | ICD-10-CM | POA: Diagnosis not present

## 2017-04-29 DIAGNOSIS — O9952 Diseases of the respiratory system complicating childbirth: Secondary | ICD-10-CM | POA: Insufficient documentation

## 2017-04-29 DIAGNOSIS — O99282 Endocrine, nutritional and metabolic diseases complicating pregnancy, second trimester: Secondary | ICD-10-CM | POA: Diagnosis not present

## 2017-04-29 DIAGNOSIS — R05 Cough: Secondary | ICD-10-CM | POA: Diagnosis not present

## 2017-04-29 DIAGNOSIS — Z79899 Other long term (current) drug therapy: Secondary | ICD-10-CM | POA: Insufficient documentation

## 2017-04-29 DIAGNOSIS — E039 Hypothyroidism, unspecified: Secondary | ICD-10-CM | POA: Diagnosis not present

## 2017-04-29 DIAGNOSIS — R059 Cough, unspecified: Secondary | ICD-10-CM

## 2017-04-29 DIAGNOSIS — F17210 Nicotine dependence, cigarettes, uncomplicated: Secondary | ICD-10-CM | POA: Diagnosis not present

## 2017-04-29 DIAGNOSIS — O99332 Smoking (tobacco) complicating pregnancy, second trimester: Secondary | ICD-10-CM | POA: Insufficient documentation

## 2017-04-29 MED ORDER — GUAIFENESIN-DM 100-10 MG/5ML PO SYRP: 5.0000 mL | ORAL_SOLUTION | ORAL | 0 refills | Status: AC | PRN

## 2017-04-29 MED ORDER — ALBUTEROL SULFATE HFA 108 (90 BASE) MCG/ACT IN AERS
2.0000 | INHALATION_SPRAY | RESPIRATORY_TRACT | Status: DC | PRN
Start: 2017-04-29 — End: 2017-04-30
  Filled 2017-04-29: qty 6.7

## 2017-04-29 NOTE — ED Triage Notes (Signed)
Cough x 1 week. Pt is [redacted] weeks pregnant. States she was seen by PCP and told to take mucinex and is not getting any relief.

## 2017-04-29 NOTE — ED Provider Notes (Signed)
[facility] Provider Note: Lowella Dell, MD, FACEP  CSN: [id number] MRN: [id number] ARRIVAL: 04/29/17 at 2106 ROOM: [facility]13/[facility]   CHIEF COMPLAINT  Cough ([redacted] weeks pregnant)   HISTORY OF PRESENT ILLNESS  04/29/17 11:41 PM Jaime Dunlap is a 37 y.o. female with a history of bronchitis who is [redacted] weeks pregnant.  She is here with a one-week history of a cough.  She describes the cough is paroxysmal and is nonproductive.  She is short of breath during the paroxysms but not at rest.  She is equivocal about having wheezing.  She has not had a fever.  She has no history of environmental allergies but this is a heavy pollen season.    Past Medical History:  Diagnosis Date  . Hypertension   . Migraine   . Thyroid disease     History reviewed. No pertinent surgical history.  No family history on file.  Social History   Tobacco Use  . Smoking status: Current Every Day Smoker    Packs/day: 0.50    Types: Cigarettes  . Smokeless tobacco: Never Used  Substance Use Topics  . Alcohol use: No  . Drug use: No    Prior to Admission medications   Medication Sig Start Date End Date Taking? Authorizing Provider  metroNIDAZOLE (FLAGYL) 500 MG tablet Take 1 tablet (500 mg total) by mouth 2 (two) times daily. 04/04/17   Tegeler, Canary Brim, MD  NIFEDIPINE PO Take by mouth.    [provider]  vitamin B-6 (PYRIDOXINE) 25 MG tablet Take 1 tablet (25 mg total) by mouth every 8 (eight) hours as needed. 04/04/17   Tegeler, Canary Brim, MD    Allergies Benadryl [diphenhydramine hcl]   REVIEW OF SYSTEMS  Negative except as noted here or in the History of Present Illness.   PHYSICAL EXAMINATION  Initial Vital Signs Blood pressure 134/70, pulse (!) 106, temperature 98.5 F (36.9 C), temperature source Oral, resp. rate 20, height 4\' 10"  (1.473 m), weight 57.6 kg (127 lb), last menstrual period 01/15/2017, SpO2 100 %, unknown if currently  breastfeeding.  Examination General: Well-developed, well-nourished female in no acute distress; appearance consistent with age of record HENT: normocephalic; atraumatic Eyes: pupils equal, round and reactive to light; extraocular muscles intact Neck: supple Heart: regular rate and rhythm Lungs: clear to auscultation bilaterally Abdomen: Gravid Extremities: No deformity; full range of motion; pulses normal Neurologic: Awake, alert and oriented; motor function intact in all extremities and symmetric; no facial droop Skin: Warm and dry Psychiatric: Flat affect   RESULTS  Summary of this visit's results, reviewed by myself:   EKG Interpretation  Date/Time:    Ventricular Rate:    PR Interval:    QRS Duration:   QT Interval:    QTC Calculation:   R Axis:     Text Interpretation:        Laboratory Studies: No results found for this or any previous visit (from the past 24 hour(s)). Imaging Studies: No results found.  ED COURSE  Nursing notes and initial vitals signs, including pulse oximetry, reviewed.  Vitals:   04/29/17 2110  BP: 134/70  Pulse: (!) 106  Resp: 20  Temp: 98.5 F (36.9 C)  TempSrc: Oral  SpO2: 100%  Weight: 57.6 kg (127 lb)  Height: 4\' 10"  (1.473 m)   We will provide an inhaler and instructed her in its use.  She was advised that over-the-counter Zyrtec and dextromethorphan, such as an Robitussin, are safe in pregnancy.  PROCEDURES  ED DIAGNOSES     ICD-10-CM   1. Cough R05        Jathen Sudano, Jonny RuizJohn, MD 04/29/17 2351

## 2017-04-29 NOTE — ED Notes (Signed)
ED Provider at bedside. 

## 2017-04-30 NOTE — Progress Notes (Signed)
RT has provided patient with her ALB MDI. RT educated patient on when to use it and how to use it. Patient is clear and in no distress at this time.

## 2018-05-25 ENCOUNTER — Encounter (HOSPITAL_BASED_OUTPATIENT_CLINIC_OR_DEPARTMENT_OTHER): Payer: Self-pay

## 2018-05-25 ENCOUNTER — Other Ambulatory Visit: Payer: Self-pay

## 2018-05-25 ENCOUNTER — Emergency Department (HOSPITAL_BASED_OUTPATIENT_CLINIC_OR_DEPARTMENT_OTHER)
Admission: EM | Admit: 2018-05-25 | Discharge: 2018-05-25 | Disposition: A | Discharge status: Home / Self Care | Payer: Medicaid Other | Attending: Emergency Medicine | Admitting: Emergency Medicine

## 2018-05-25 DIAGNOSIS — F17210 Nicotine dependence, cigarettes, uncomplicated: Secondary | ICD-10-CM | POA: Diagnosis not present

## 2018-05-25 DIAGNOSIS — Z79899 Other long term (current) drug therapy: Secondary | ICD-10-CM | POA: Diagnosis not present

## 2018-05-25 DIAGNOSIS — K0889 Other specified disorders of teeth and supporting structures: Secondary | ICD-10-CM

## 2018-05-25 DIAGNOSIS — I10 Essential (primary) hypertension: Secondary | ICD-10-CM | POA: Diagnosis not present

## 2018-05-25 MED ORDER — PENICILLIN V POTASSIUM 500 MG PO TABS: 500.0000 mg | ORAL_TABLET | Freq: Four times a day (QID) | ORAL | 0 refills | Status: AC

## 2018-05-25 MED ORDER — IBUPROFEN 400 MG PO TABS
600.0000 mg | ORAL_TABLET | Freq: Once | ORAL | Status: AC
  Administered 2018-05-25: 22:00:00 600 mg via ORAL
  Filled 2018-05-25: qty 1

## 2018-05-25 MED ORDER — NIFEDIPINE ER OSMOTIC RELEASE 60 MG PO TB24: 60.0000 mg | ORAL_TABLET | Freq: Every day | ORAL | 0 refills | Status: AC

## 2018-05-25 NOTE — Discharge Instructions (Addendum)
Please take Ibuprofen (Advil, motrin) and Tylenol (acetaminophen) to relieve your pain.  You may take up to 600 MG (3 pills) of normal strength ibuprofen every 8 hours as needed.  In between doses of ibuprofen you make take tylenol, up to 1,000 mg (two extra strength pills).  Do not take more than 3,000 mg tylenol in a 24 hour period.  Please check all medication labels as many medications such as pain and cold medications may contain tylenol.  Do not drink alcohol while taking these medications.  Do not take other NSAID'S while taking ibuprofen (such as aleve or naproxen).  Please take ibuprofen with food to decrease stomach upset.  You may have diarrhea from the antibiotics.  It is very important that you continue to take the antibiotics even if you get diarrhea unless a medical professional tells you that you may stop taking them.  If you stop too early the bacteria you are being treated for will become stronger and you may need different, more powerful antibiotics that have more side effects and worsening diarrhea.  Please stay well hydrated and consider probiotics as they may decrease the severity of your diarrhea.  Please be aware that if you take any hormonal contraception (birth control pills, nexplanon, the ring, etc) that your birth control will not work while you are taking antibiotics and you need to use back up protection as directed on the birth control medication information insert.   While in the ED your blood pressure was high.  Please follow up with your primary care doctor or the wellness clinic for repeat evaluation as you may need medication.  High blood pressure can cause long term, potentially serious, damage if left untreated.

## 2018-05-25 NOTE — ED Triage Notes (Signed)
Pt reports dental infection x 3 days with pain. Pt reports bad taste in mouth. Pt unable to get in to see a dentist at this time.

## 2018-05-25 NOTE — ED Provider Notes (Signed)
[facility] EMERGENCY DEPARTMENT Provider Note   CSN: [id number] Arrival date & time: 05/25/18  2059    History   Chief Complaint Chief Complaint  Patient presents with  . Dental Pain    HPI Jaime Dunlap is a 38 y.o. female with a past medical history of HTN who presents today for evaluation of dental pain.  She reports that she has 2 bad teeth on the right side.  She reports that they have been broken however have started hurting more over the past 2 days.  She has been unable to see a dentist.  She denies any difficulty swallowing or speaking.  Her pain is 8 out of 10.  It is made worse with hot and cold liquids.   She reports that she has a slight headache on the right side.  In the past when she has had dental related problems she has had a similar headache and the headache is not concerning to her.  He does not wish for her headache to specifically be evaluated.  She also reports that she has been out of her blood pressure medicines.  She takes nifedipine 60 mg ER.       HPI  Past Medical History:  Diagnosis Date  . Hypertension   . Migraine   . Thyroid disease     There are no active problems to display for this patient.   Past Surgical History:  Procedure Laterality Date  . TUBAL LIGATION       OB History    Gravida  6   Para  3   Term      Preterm      AB      Living        SAB      TAB      Ectopic      Multiple      Live Births               Home Medications    Prior to Admission medications   Medication Sig Start Date End Date Taking? Authorizing Provider  guaiFENesin-dextromethorphan (ROBITUSSIN DM) 100-10 MG/5ML syrup Take 5 mLs by mouth every 4 (four) hours as needed for cough. 04/29/17   Molpus, John, MD  metroNIDAZOLE (FLAGYL) 500 MG tablet Take 1 tablet (500 mg total) by mouth 2 (two) times daily. 04/04/17   Tegeler, Canary Brim, MD  NIFEdipine (PROCARDIA XL/NIFEDICAL XL) 60 MG 24 hr tablet Take 1 tablet (60 mg  total) by mouth daily for 30 days. 05/25/18 06/24/18  Cristina Gong, PA-C  penicillin v potassium (VEETID) 500 MG tablet Take 1 tablet (500 mg total) by mouth 4 (four) times daily for 7 days. 05/25/18 06/01/18  Cristina Gong, PA-C  vitamin B-6 (PYRIDOXINE) 25 MG tablet Take 1 tablet (25 mg total) by mouth every 8 (eight) hours as needed. 04/04/17   Tegeler, Canary Brim, MD    Family History No family history on file.  Social History Social History   Tobacco Use  . Smoking status: Current Every Day Smoker    Packs/day: 0.50    Types: Cigarettes  . Smokeless tobacco: Never Used  Substance Use Topics  . Alcohol use: No  . Drug use: No     Allergies   Benadryl [diphenhydramine hcl]   Review of Systems Review of Systems  Constitutional: Negative for chills and fever.  HENT: Positive for dental problem. Negative for congestion, drooling, sore throat, trouble swallowing and voice change.  Eyes: Negative for visual disturbance.  Respiratory: Negative for chest tightness and shortness of breath.   Cardiovascular: Negative for chest pain and palpitations.  Gastrointestinal: Negative for abdominal pain.  Genitourinary: Negative for dysuria.  Musculoskeletal: Negative for neck pain.  Neurological: Positive for headaches. Negative for dizziness, weakness and numbness.  All other systems reviewed and are negative.    Physical Exam Updated Vital Signs BP (!) 194/112   Pulse 91   Temp 98.7 F (37.1 C) (Oral)   Resp 20   Ht 4\' 10"  (1.473 m)   Wt 54.4 kg   LMP 04/28/2018   SpO2 100%   Breastfeeding Unknown   BMI 25.08 kg/m   Physical Exam Vitals signs and nursing note reviewed.  Constitutional:      General: She is not in acute distress.    Appearance: She is not ill-appearing.  HENT:     Head: Normocephalic.     Nose: Nose normal.     Mouth/Throat:     Mouth: Mucous membranes are moist.     Comments: There is a broken right maxillary molar and a broken  right mandibular molar.  There is no intraoral swelling or edema.  No elevation of the floor of the mouth or significant intraoral swelling.  Uvula is midline.  No trismus. Neck:     Musculoskeletal: Normal range of motion. No neck rigidity.  Cardiovascular:     Rate and Rhythm: Normal rate.  Pulmonary:     Effort: Pulmonary effort is normal. No respiratory distress.  Lymphadenopathy:     Cervical: No cervical adenopathy.  Neurological:     General: No focal deficit present.     Mental Status: She is alert and oriented to person, place, and time.     Cranial Nerves: No cranial nerve deficit.     Comments: Cranial nerves II through XII are grossly intact.  Her speech is normal with normal mentation.  Psychiatric:        Mood and Affect: Mood normal.      ED Treatments / Results  Labs (all labs ordered are listed, but only abnormal results are displayed) Labs Reviewed - No data to display  EKG None  Radiology No results found.  Procedures Procedures (including critical care time)  Medications Ordered in ED Medications  ibuprofen (ADVIL) tablet 600 mg (600 mg Oral Given 05/25/18 2140)     Initial Impression / Assessment and Plan / ED Course  I have reviewed the triage vital signs and the nursing notes.  Pertinent labs & imaging results that were available during my care of the patient were reviewed by me and considered in my medical decision making (see chart for details).       Patient presents today for evaluation of dental pain.  She has no gross abscess that would require drainage.  No evidence of deep spread of infection, no trismus elevation of the floor the mouth, or difficulty swallowing or breathing.  Will treat with OTC pain medicine, and penicillin.  While in the emergency room her blood pressure was elevated, and she has not been compliant with her blood pressure med.  I gave her a one-month refill on her nifedipine at her baseline dose.  We discussed  evaluation of this in the setting of her headache, she made the informed decision to decline this, stating that she knows her headache is related to her dental pain.  She was offered additional evaluation of her hypertension which she refused.  Given dental resource  guide.   Return precautions were discussed with patient who states their understanding.  At the time of discharge patient denied any unaddressed complaints or concerns.  Patient is agreeable for discharge home.  Final Clinical Impressions(s) / ED Diagnoses   Final diagnoses:  Pain, dental  Hypertension, unspecified type    ED Discharge Orders         Ordered    NIFEdipine (PROCARDIA XL/NIFEDICAL XL) 60 MG 24 hr tablet  Daily     05/25/18 2129    penicillin v potassium (VEETID) 500 MG tablet  4 times daily     05/25/18 2158           Cristina Gong, Cordelia Poche 05/25/18 2236    Geoffery Lyons, MD 05/25/18 2312

## 2019-05-21 ENCOUNTER — Other Ambulatory Visit: Payer: Self-pay

## 2019-05-21 DIAGNOSIS — R002 Palpitations: Secondary | ICD-10-CM | POA: Insufficient documentation

## 2019-05-21 DIAGNOSIS — F17210 Nicotine dependence, cigarettes, uncomplicated: Secondary | ICD-10-CM | POA: Insufficient documentation

## 2019-05-21 DIAGNOSIS — Z79899 Other long term (current) drug therapy: Secondary | ICD-10-CM | POA: Diagnosis not present

## 2019-05-21 DIAGNOSIS — I10 Essential (primary) hypertension: Secondary | ICD-10-CM | POA: Diagnosis not present

## 2019-05-21 DIAGNOSIS — T887XXA Unspecified adverse effect of drug or medicament, initial encounter: Secondary | ICD-10-CM | POA: Insufficient documentation

## 2019-05-21 DIAGNOSIS — R1013 Epigastric pain: Secondary | ICD-10-CM | POA: Insufficient documentation

## 2019-05-21 DIAGNOSIS — R519 Headache, unspecified: Secondary | ICD-10-CM | POA: Insufficient documentation

## 2019-05-21 NOTE — ED Triage Notes (Signed)
Migraine started at 2100. Took Tylenol and BC powder 1 hour ago, had 1 emesis after and pt states felt heart racing after. Only c/o of headache at this time. Denies cardiac sx. VSS. Alert, amb NAD.

## 2019-05-22 ENCOUNTER — Encounter (HOSPITAL_BASED_OUTPATIENT_CLINIC_OR_DEPARTMENT_OTHER): Payer: Self-pay | Admitting: Emergency Medicine

## 2019-05-22 ENCOUNTER — Emergency Department (HOSPITAL_BASED_OUTPATIENT_CLINIC_OR_DEPARTMENT_OTHER)
Admission: EM | Admit: 2019-05-22 | Discharge: 2019-05-22 | Disposition: A | Discharge status: Home / Self Care | Payer: Medicaid Other | Attending: Emergency Medicine | Admitting: Emergency Medicine

## 2019-05-22 DIAGNOSIS — T50905A Adverse effect of unspecified drugs, medicaments and biological substances, initial encounter: Secondary | ICD-10-CM

## 2019-05-22 DIAGNOSIS — R519 Headache, unspecified: Secondary | ICD-10-CM

## 2019-05-22 LAB — PREGNANCY, URINE: Preg Test, Ur: NEGATIVE

## 2019-05-22 MED ORDER — ALUM & MAG HYDROXIDE-SIMETH 200-200-20 MG/5ML PO SUSP
30.0000 mL | Freq: Once | ORAL | Status: AC
  Administered 2019-05-22: 01:00:00 30 mL via ORAL
  Filled 2019-05-22: qty 30

## 2019-05-22 MED ORDER — LIDOCAINE VISCOUS HCL 2 % MT SOLN
OROMUCOSAL | Status: AC
  Filled 2019-05-22: qty 15

## 2019-05-22 MED ORDER — DEXAMETHASONE SODIUM PHOSPHATE 10 MG/ML IJ SOLN: 10.0000 mg | Freq: Once | INTRAMUSCULAR | Status: DC

## 2019-05-22 MED ORDER — KETOROLAC TROMETHAMINE 60 MG/2ML IM SOLN: 60.0000 mg | Freq: Once | INTRAMUSCULAR | Status: DC

## 2019-05-22 NOTE — ED Provider Notes (Signed)
[facility] EMERGENCY DEPARTMENT Provider Note   CSN: [id number] Arrival date & time: 05/21/19  2350     History Chief Complaint  Patient presents with  . Migraine    Jaime Dunlap is a 39 y.o. female.  The history is provided by the patient.  Migraine This is a recurrent problem. The current episode started 3 to 5 hours ago. The problem occurs constantly. The problem has been rapidly improving. Associated symptoms include headaches. Pertinent negatives include no chest pain, no abdominal pain and no shortness of breath. Nothing aggravates the symptoms. Relieved by: tylenol and the BC powder  She has tried acetaminophen for the symptoms. The treatment provided significant relief.  Patient had a posterior right migraine related to ongoing stress.  No weakness no numbness. No changes in vision nor speech.  Not sudden onset not the worst HA of her life. She took one tylenol but symptoms did not resolve in 7 minute so she took a BC powder. She then had cramping in her epigastrum and felt palpitations.  She states the palpitations continued in triage during having her vitals taken.  No CP, no SOB.  No leg pain.       Past Medical History:  Diagnosis Date  . Hypertension   . Migraine   . Thyroid disease     There are no problems to display for this patient.   Past Surgical History:  Procedure Laterality Date  . TUBAL LIGATION       OB History    Gravida  6   Para  3   Term      Preterm      AB      Living        SAB      TAB      Ectopic      Multiple      Live Births              History reviewed. No pertinent family history.  Social History   Tobacco Use  . Smoking status: Current Every Day Smoker    Packs/day: 0.50    Types: Cigarettes  . Smokeless tobacco: Never Used  Substance Use Topics  . Alcohol use: No  . Drug use: No    Home Medications Prior to Admission medications   Medication Sig Start Date End Date Taking?  Authorizing Provider  guaiFENesin-dextromethorphan (ROBITUSSIN DM) 100-10 MG/5ML syrup Take 5 mLs by mouth every 4 (four) hours as needed for cough. 04/29/17   Molpus, John, MD  metroNIDAZOLE (FLAGYL) 500 MG tablet Take 1 tablet (500 mg total) by mouth 2 (two) times daily. 04/04/17   Tegeler, Canary Brim, MD  NIFEdipine (PROCARDIA XL/NIFEDICAL XL) 60 MG 24 hr tablet Take 1 tablet (60 mg total) by mouth daily for 30 days. 05/25/18 06/24/18  Cristina Gong, PA-C  vitamin B-6 (PYRIDOXINE) 25 MG tablet Take 1 tablet (25 mg total) by mouth every 8 (eight) hours as needed. 04/04/17   Tegeler, Canary Brim, MD    Allergies    Benadryl [diphenhydramine hcl]  Review of Systems   Review of Systems  Constitutional: Negative for diaphoresis and fever.  HENT: Negative for congestion and sore throat.   Eyes: Negative for visual disturbance.  Respiratory: Negative for shortness of breath.   Cardiovascular: Negative for chest pain and leg swelling.  Gastrointestinal: Negative for abdominal pain.  Genitourinary: Negative for difficulty urinating.  Musculoskeletal: Negative for arthralgias, neck pain and neck stiffness.  Skin: Negative  for rash.  Neurological: Positive for headaches. Negative for dizziness, tremors, seizures, syncope, facial asymmetry, speech difficulty, weakness, light-headedness and numbness.  Psychiatric/Behavioral: Negative for confusion and decreased concentration.    Physical Exam Updated Vital Signs BP (!) 158/90 (BP Location: Right Arm)   Pulse 82   Temp 98.2 F (36.8 C) (Oral)   Resp 20   Ht 4\' 10"  (1.473 m)   Wt 56.7 kg   LMP 04/24/2019   SpO2 99%   BMI 26.13 kg/m   Physical Exam Vitals and nursing note reviewed.  Constitutional:      General: She is not in acute distress.    Appearance: Normal appearance.  HENT:     Head: Normocephalic and atraumatic.     Nose: Nose normal.     Mouth/Throat:     Mouth: Mucous membranes are moist.     Pharynx: Oropharynx  is clear.  Eyes:     Extraocular Movements: Extraocular movements intact.     Conjunctiva/sclera: Conjunctivae normal.     Pupils: Pupils are equal, round, and reactive to light.  Cardiovascular:     Rate and Rhythm: Normal rate and regular rhythm.     Pulses: Normal pulses.     Heart sounds: Normal heart sounds.  Pulmonary:     Effort: Pulmonary effort is normal.     Breath sounds: Normal breath sounds.  Abdominal:     General: Abdomen is flat. Bowel sounds are normal.     Tenderness: There is no abdominal tenderness. There is no guarding or rebound.  Musculoskeletal:        General: Normal range of motion.     Cervical back: Normal range of motion and neck supple.  Skin:    General: Skin is warm and dry.     Capillary Refill: Capillary refill takes less than 2 seconds.  Neurological:     General: No focal deficit present.     Mental Status: She is alert and oriented to person, place, and time.     Cranial Nerves: No cranial nerve deficit.     Deep Tendon Reflexes: Reflexes normal.  Psychiatric:        Mood and Affect: Mood is anxious.     ED Results / Procedures / Treatments   Labs (all labs ordered are listed, but only abnormal results are displayed) Labs Reviewed  PREGNANCY, URINE    EKG None  Radiology No results found.  Procedures Procedures (including critical care time)  Medications Ordered in ED Medications  alum & mag hydroxide-simeth (MAALOX/MYLANTA) 200-200-20 MG/5ML suspension 30 mL (30 mLs Oral Given 05/22/19 0112)    ED Course  I have reviewed the triage vital signs and the nursing notes.  Pertinent labs & imaging results that were available during my care of the patient were reviewed by me and considered in my medical decision making (see chart for details).    The patient was not tachycardiac in triage.  I suspect the cramp and palpitations were secondary to the powdered aspirin and caffeine in the [facility] powder as she states she did not take one  in the past.  She did not want the migraine cocktail that I had originally ordered as she stated her headache was better.  I offered a GI cocktail to sooth her stomach and advised that she not take [facility] nor [facility]ie powders again.  Patient took the medication and felt markedly improved.    Jayleene Sermons was evaluated in Emergency Department on 05/22/2019 for the symptoms described  in the history of present illness. She was evaluated in the context of the global COVID-19 pandemic, which necessitated consideration that the patient might be at risk for infection with the SARS-CoV-2 virus that causes COVID-19. Institutional protocols and algorithms that pertain to the evaluation of patients at risk for COVID-19 are in a state of rapid change based on information released by regulatory bodies including the CDC and federal and state organizations. These policies and algorithms were followed during the patient's care in the ED.  Final Clinical Impression(s) / ED Diagnoses Final diagnoses:  Adverse effect of drug, initial encounter   Return for intractable cough, coughing up blood,fevers >100.4 unrelieved by medication, shortness of breath, intractable vomiting, chest pain, shortness of breath, weakness,numbness, changes in speech, facial asymmetry,abdominal pain, passing out,Inability to tolerate liquids or food, cough, altered mental status or any concerns. No signs of systemic illness or infection. The patient is nontoxic-appearing on exam and vital signs are within normal limits.   I have reviewed the triage vital signs and the nursing notes. Pertinent labs &imaging results that were available during my care of the patient were reviewed by me and considered in my medical decision making (see chart for details).After history, exam, and medical workup I feel the patient has beenappropriately medically screened and is safe for discharge home. Pertinent diagnoses were discussed with the patient. Patient  was given return precautions.    Payten Hobin, MD 05/22/19 [phone]

## 2019-11-20 ENCOUNTER — Encounter (HOSPITAL_BASED_OUTPATIENT_CLINIC_OR_DEPARTMENT_OTHER): Payer: Self-pay

## 2019-11-20 ENCOUNTER — Emergency Department (HOSPITAL_BASED_OUTPATIENT_CLINIC_OR_DEPARTMENT_OTHER)
Admission: EM | Admit: 2019-11-20 | Discharge: 2019-11-20 | Disposition: A | Discharge status: Home / Self Care | Payer: Medicaid Other | Attending: Emergency Medicine | Admitting: Emergency Medicine

## 2019-11-20 ENCOUNTER — Other Ambulatory Visit: Payer: Self-pay

## 2019-11-20 DIAGNOSIS — R519 Headache, unspecified: Secondary | ICD-10-CM | POA: Diagnosis not present

## 2019-11-20 DIAGNOSIS — Z8669 Personal history of other diseases of the nervous system and sense organs: Secondary | ICD-10-CM | POA: Diagnosis not present

## 2019-11-20 DIAGNOSIS — F17210 Nicotine dependence, cigarettes, uncomplicated: Secondary | ICD-10-CM | POA: Insufficient documentation

## 2019-11-20 DIAGNOSIS — I10 Essential (primary) hypertension: Secondary | ICD-10-CM | POA: Diagnosis not present

## 2019-11-20 DIAGNOSIS — Z79899 Other long term (current) drug therapy: Secondary | ICD-10-CM | POA: Diagnosis not present

## 2019-11-20 MED ORDER — AMOXICILLIN 500 MG PO CAPS
500.0000 mg | ORAL_CAPSULE | Freq: Three times a day (TID) | ORAL | 0 refills | Status: AC
Start: 2019-11-20 — End: 2019-11-27

## 2019-11-20 MED ORDER — AMOXICILLIN 500 MG PO CAPS
500.0000 mg | ORAL_CAPSULE | Freq: Once | ORAL | Status: AC
  Administered 2019-11-20: 500 mg via ORAL
  Filled 2019-11-20: qty 1

## 2019-11-20 NOTE — ED Triage Notes (Signed)
Pt c/o left upper dental pain x "2 years"-NAD-steady gait

## 2019-11-20 NOTE — ED Provider Notes (Signed)
[facility] EMERGENCY DEPARTMENT Provider Note   CSN: [id number] Arrival date & time: 11/20/19  1928     History Chief Complaint  Patient presents with  . Dental Pain    Jaime Dunlap is a 39 y.o. female with PMH/o HTN, Migraine, Thyroid who presents for evaluation for evaluation of left-sided facial pain.  She states this has occurred intermittently for the last 2 years.  She has had episodes of it but has never sought evaluation for it.  She reports that over the last 2 to 3 days, she has had an episode.  She states that the pain starts in her left upper lip and radiates her face.  She states that it will be like a spasm pain.  She states it lasts a few seconds before resolving.  She has not tried to take any medication.  She does not note any facial swelling, warmth, erythema.  She has not noted any fevers.  She has had a history of dental issues and has not had most of her teeth removed.  She does not feel like her tooth is hurting.  She has 1 canine tooth that is left that she has not noticed any surrounding erythema.  She states that sometimes it will hurt more when she tries to move her mouth.  She has not noted any vomiting.  The history is provided by the patient.       Past Medical History:  Diagnosis Date  . Hypertension   . Migraine   . Thyroid disease     There are no problems to display for this patient.   Past Surgical History:  Procedure Laterality Date  . TUBAL LIGATION       OB History    Gravida  6   Para  3   Term      Preterm      AB      Living        SAB      TAB      Ectopic      Multiple      Live Births              No family history on file.  Social History   Tobacco Use  . Smoking status: Current Every Day Smoker    Packs/day: 0.50    Types: Cigarettes  . Smokeless tobacco: Never Used  Vaping Use  . Vaping Use: Never used  Substance Use Topics  . Alcohol use: No  . Drug use: No    Home  Medications Prior to Admission medications   Medication Sig Start Date End Date Taking? Authorizing Provider  amoxicillin (AMOXIL) 500 MG capsule Take 1 capsule (500 mg total) by mouth 3 (three) times daily for 7 days. 11/20/19 11/27/19  Maxwell Caul, PA-C  guaiFENesin-dextromethorphan (ROBITUSSIN DM) 100-10 MG/5ML syrup Take 5 mLs by mouth every 4 (four) hours as needed for cough. 04/29/17   Molpus, John, MD  NIFEdipine (PROCARDIA XL/NIFEDICAL XL) 60 MG 24 hr tablet Take 1 tablet (60 mg total) by mouth daily for 30 days. 05/25/18 06/24/18  Cristina Gong, PA-C  vitamin B-6 (PYRIDOXINE) 25 MG tablet Take 1 tablet (25 mg total) by mouth every 8 (eight) hours as needed. 04/04/17   Tegeler, Canary Brim, MD    Allergies    Benadryl [diphenhydramine hcl]  Review of Systems   Review of Systems  Constitutional: Negative for fever.  HENT: Negative for facial swelling and trouble swallowing.  Facial pain  All other systems reviewed and are negative.   Physical Exam Updated Vital Signs BP (!) 154/103 (BP Location: Left Arm)   Pulse (!) 105   Temp 98.2 F (36.8 C) (Oral)   Resp 20   Ht 4\' 10"  (1.473 m)   LMP 11/17/2019   SpO2 100%   BMI 26.13 kg/m   Physical Exam Vitals and nursing note reviewed.  Constitutional:      Appearance: She is well-developed.  HENT:     Head: Normocephalic and atraumatic.     Comments: Face is symmetric in appearance without any overlying warmth, erythema or edema.  No trismus.  Airway is patent, phonation is intact.  No tenderness noted to bilateral temples.    Mouth/Throat:     Dentition: Abnormal dentition.     Comments: Tenderness to palpation noted to left upper lip.  There is no warmth, swelling, erythema.  She has missing dentition throughout and is missing her her 2 front teeth. tooth #11 is intact but does appear to be slightly impacted at the gum.  No surrounding gingival erythema, edema.  No abscess.  No trismus.  Posterior oropharynx  is clear without any signs of erythema, edema.  Airways patent, phonation is intact.  Eyes:     General: No scleral icterus.       Right eye: No discharge.        Left eye: No discharge.     Conjunctiva/sclera: Conjunctivae normal.  Pulmonary:     Effort: Pulmonary effort is normal.  Skin:    General: Skin is warm and dry.  Neurological:     Mental Status: She is alert.     Comments: Cranial nerves III-XII intact No slurred speech. No facial droop.   Psychiatric:        Speech: Speech normal.        Behavior: Behavior normal.     ED Results / Procedures / Treatments   Labs (all labs ordered are listed, but only abnormal results are displayed) Labs Reviewed - No data to display  EKG None  Radiology No results found.  Procedures Procedures (including critical care time)  Medications Ordered in ED Medications  amoxicillin (AMOXIL) capsule 500 mg (500 mg Oral Given 11/20/19 2016)    ED Course  I have reviewed the triage vital signs and the nursing notes.  Pertinent labs & imaging results that were available during my care of the patient were reviewed by me and considered in my medical decision making (see chart for details).    MDM Rules/Calculators/A&P                          39 year old female who presents for evaluation of left-sided facial pain.  She reports that this is intermittently occurred over the last 2 years.  She states she will get episodes where she will have pain and feels like it spasms up her face.  This last 1 has been ongoing for the last 2 to 3 days.  She states that she is not taking medication.  No fevers.  Initial arrival, she is afebrile nontoxic-appearing.  Vital signs are stable.  Face is symmetric in appearance without any overlying warmth, erythema, edema.  Lipase without any signs of infectious etiology.  She has no cranial nerve deficits.  She does have an incisor tooth that appears to be slightly impacted but no surrounding gingival  erythema, edema.  She has no tenderness noted to the  temples.  History/physical exam a contact discerning for temporal arteritis.  Doubt that this is TriGen neuralgia given the distribution of her pain.  She states it starts in the lip and radiates up in a line on her left side of her face.  It does not follow a V1, V2 or V3 distribution.  Additionally, doubt facial cellulitis or infectious etiology.  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  Question if the tooth is causing the pain.  We will plan to put her on amoxicillin.  Patient with no known drug allergies.  Patient instructed to follow-up with dentistry. At this time, patient exhibits no emergent life-threatening condition that require further evaluation in ED. Patient had ample opportunity for questions and discussion. All patient's questions were answered with full understanding. Strict return precautions discussed. Patient expresses understanding and agreement to plan.   Portions of this note were generated with Scientist, clinical (histocompatibility and immunogenetics). Dictation errors may occur despite best attempts at proofreading.   Final Clinical Impression(s) / ED Diagnoses Final diagnoses:  Facial pain    Rx / DC Orders ED Discharge Orders         Ordered    amoxicillin (AMOXIL) 500 MG capsule  3 times daily        11/20/19 2013           Rosana Hoes 11/20/19 2227    Rolan Bucco, MD 11/20/19 2315

## 2019-11-20 NOTE — Discharge Instructions (Signed)
You can take Tylenol or Ibuprofen as directed for pain. You can alternate Tylenol and Ibuprofen every 4 hours. If you take Tylenol at 1pm, then you can take Ibuprofen at 5pm. Then you can take Tylenol again at 9pm.   Take antibiotics as directed. Please take all of your antibiotics until finished.  Follow-up with referred to dentist office.  Return to emergency department for any worsening pain, redness or swelling of the face, fevers, numbness/weakness or any other worsening concerning symptoms.  Please follow-up with one of the dental clinics provided to you below or in your paperwork. Call and tell them you were seen in the Emergency Dept and arrange for an appointment. You may have to call multiple places in order to find a place to be seen.  Dental Assistance If the dentist on-call cannot see you, please use the resources below:   Patients with Medicaid: [facility] [phone] [address], [phone] [address], [phone]  If unable to pay, or uninsured, contact [facility] [phone]) or [facility] Department [phone] in [place], [phone] in [facility]) to become qualified for the adult dental clinic  Other Low-Cost Community Dental Services: [facility]- [address] Natasha Bence [place], [place], [address]    [phone], Ext. [phone]    2nd and 4th Thursday of the month at 6:30am    10 clients each day by appointment, can sometimes see walk-in     patients if someone does not show for an appointment [facility]- [address] [place], [place], [address]    [phone] [facility] [address], [place], [place], [address]    [phone]  [facility] Department- [phone] [facility] Department- [phone] [facility] Department- [phone]

## 2020-02-28 ENCOUNTER — Emergency Department (HOSPITAL_BASED_OUTPATIENT_CLINIC_OR_DEPARTMENT_OTHER)
Admission: EM | Admit: 2020-02-28 | Discharge: 2020-02-28 | Disposition: A | Discharge status: Home / Self Care | Payer: Medicaid Other | Attending: Emergency Medicine | Admitting: Emergency Medicine

## 2020-02-28 ENCOUNTER — Encounter (HOSPITAL_BASED_OUTPATIENT_CLINIC_OR_DEPARTMENT_OTHER): Payer: Self-pay | Admitting: Emergency Medicine

## 2020-02-28 ENCOUNTER — Other Ambulatory Visit: Payer: Self-pay

## 2020-02-28 DIAGNOSIS — I10 Essential (primary) hypertension: Secondary | ICD-10-CM | POA: Insufficient documentation

## 2020-02-28 DIAGNOSIS — H73893 Other specified disorders of tympanic membrane, bilateral: Secondary | ICD-10-CM | POA: Insufficient documentation

## 2020-02-28 DIAGNOSIS — H938X3 Other specified disorders of ear, bilateral: Secondary | ICD-10-CM | POA: Diagnosis present

## 2020-02-28 DIAGNOSIS — H6593 Unspecified nonsuppurative otitis media, bilateral: Secondary | ICD-10-CM

## 2020-02-28 DIAGNOSIS — F17210 Nicotine dependence, cigarettes, uncomplicated: Secondary | ICD-10-CM | POA: Diagnosis not present

## 2020-02-28 DIAGNOSIS — Z79899 Other long term (current) drug therapy: Secondary | ICD-10-CM | POA: Diagnosis not present

## 2020-02-28 MED ORDER — FLUTICASONE PROPIONATE 50 MCG/ACT NA SUSP
1.0000 | Freq: Every day | NASAL | 0 refills | Status: AC
Start: 2020-02-28 — End: 2020-03-29

## 2020-02-28 MED ORDER — CETIRIZINE HCL 10 MG PO TABS: 10.0000 mg | ORAL_TABLET | Freq: Every day | ORAL | 0 refills | Status: AC

## 2020-02-28 NOTE — Discharge Instructions (Addendum)
Please pick up medication and take as prescribed.  Follow up with [facility] for primary care purposes and for recheck of your symptoms.  Return to the ED for any worsening symptoms

## 2020-02-28 NOTE — ED Triage Notes (Signed)
Pt reports ear feels muffled x 1 week. Pt tried over the counter ear drops without relief. Pt reports pain only when she lays down.

## 2020-02-28 NOTE — ED Provider Notes (Signed)
[facility] EMERGENCY DEPARTMENT Provider Note   CSN: [id number] Arrival date & time: 02/28/20  1329     History Chief Complaint  Patient presents with  . Ear Fullness    Jaime Dunlap is a 40 y.o. female with PMHx HTN, thyroid disease, and migraine who presents to the ED today with complaint of gradual onset, constant, waxing and waning, bilateral ear fullness ( right greater than left ) x 1 week. Pt states that her ears feel like they are popping at times. She will lay on her right side and have mild pain to her ear. She states when she lays on this side she feels like her ear pops and she can hear appropriately however when she lays on her left side the right ear feels like it gets clogged again. Pt went to the store and bought OTC ear wax removal drops without relief prompting her to come to the ED today. She denies fevers, chills, ear drainage, sinus pressure, rhinorrhea, nasal congestion, or any other associated symptoms.   The history is provided by the patient and medical records.       Past Medical History:  Diagnosis Date  . Hypertension   . Migraine   . Thyroid disease     There are no problems to display for this patient.   Past Surgical History:  Procedure Laterality Date  . TUBAL LIGATION       OB History    Gravida  6   Para  3   Term      Preterm      AB      Living        SAB      IAB      Ectopic      Multiple      Live Births              No family history on file.  Social History   Tobacco Use  . Smoking status: Current Every Day Smoker    Packs/day: 0.50    Types: Cigarettes  . Smokeless tobacco: Never Used  Vaping Use  . Vaping Use: Never used  Substance Use Topics  . Alcohol use: No  . Drug use: No    Home Medications Prior to Admission medications   Medication Sig Start Date End Date Taking? Authorizing Provider  cetirizine (ZYRTEC ALLERGY) 10 MG tablet Take 1 tablet (10 mg total) by mouth daily.  02/28/20  Yes Harlis Champoux, PA-C  fluticasone (FLONASE) 50 MCG/ACT nasal spray Place 1 spray into both nostrils daily. 02/28/20 03/29/20 Yes Lyndel Sarate, PA-C  guaiFENesin-dextromethorphan (ROBITUSSIN DM) 100-10 MG/5ML syrup Take 5 mLs by mouth every 4 (four) hours as needed for cough. 04/29/17   Molpus, John, MD  NIFEdipine (PROCARDIA XL/NIFEDICAL XL) 60 MG 24 hr tablet Take 1 tablet (60 mg total) by mouth daily for 30 days. 05/25/18 06/24/18  Cristina Gong, PA-C  vitamin B-6 (PYRIDOXINE) 25 MG tablet Take 1 tablet (25 mg total) by mouth every 8 (eight) hours as needed. 04/04/17   Tegeler, Canary Brim, MD    Allergies    Benadryl [diphenhydramine hcl]  Review of Systems   Review of Systems  Constitutional: Negative for chills and fever.  HENT: Negative for rhinorrhea, sinus pressure, sore throat and trouble swallowing.        + ear fullness  All other systems reviewed and are negative.   Physical Exam Updated Vital Signs BP (!) 157/100 (BP Location: Left Arm)  Pulse 88   Temp 98.5 F (36.9 C) (Oral)   Resp 18   Ht 4\' 10"  (1.473 m)   Wt 64 kg   LMP 02/09/2020   SpO2 100%   Breastfeeding No   BMI 29.49 kg/m   Physical Exam Vitals and nursing note reviewed.  Constitutional:      Appearance: She is not ill-appearing.  HENT:     Head: Normocephalic and atraumatic.     Right Ear: Ear canal and external ear normal. There is no impacted cerumen.     Left Ear: Ear canal and external ear normal. There is no impacted cerumen.     Ears:     Comments: Bilateral TM effusion appreciated. Eyes:     Conjunctiva/sclera: Conjunctivae normal.  Cardiovascular:     Rate and Rhythm: Normal rate and regular rhythm.     Pulses: Normal pulses.  Pulmonary:     Effort: Pulmonary effort is normal.     Breath sounds: Normal breath sounds. No wheezing, rhonchi or rales.  Skin:    General: Skin is warm and dry.     Coloration: Skin is not jaundiced.  Neurological:     Mental Status:  She is alert.     ED Results / Procedures / Treatments   Labs (all labs ordered are listed, but only abnormal results are displayed) Labs Reviewed - No data to display  EKG None  Radiology No results found.  Procedures Procedures   Medications Ordered in ED Medications - No data to display  ED Course  I have reviewed the triage vital signs and the nursing notes.  Pertinent labs & imaging results that were available during my care of the patient were reviewed by me and considered in my medical decision making (see chart for details).    MDM Rules/Calculators/A&P                          40 year old female presents to the ED today with complaint of bilateral ear fullness for the past week with intermittent popping of her ears.  Has tried over-the-counter ear wax removal without relief.  On arrival to the ED vitals are stable.  Patient is afebrile, nontachycardic and nontachypneic.  She was to be in no acute distress.  She denies any ear pain, ear drainage, fevers, chills.  She has not been swimming recently.  On exam she does have bilateral tympanic membrane effusion appreciated.  She does report that she had a viral illness 3 weeks ago.  I suspect the fluid in her ears are causing her fullness.  She has no impacted cerumen appreciated that needs to be removed at this time.  There is no signs of otitis externa or otitis media at this time.  Will discharge home with Flonase and Zyrtec with PCP follow-up.  Patient is in agreement with plan is stable for discharge.   This note was prepared using Dragon voice recognition software and may include unintentional dictation errors due to the inherent limitations of voice recognition software.  Final Clinical Impression(s) / ED Diagnoses Final diagnoses:  Fluid level behind tympanic membrane of both ears    Rx / DC Orders ED Discharge Orders         Ordered    cetirizine (ZYRTEC ALLERGY) 10 MG tablet  Daily        02/28/20 1429     fluticasone (FLONASE) 50 MCG/ACT nasal spray  Daily  02/28/20 1429           Discharge Instructions     Please pick up medication and take as prescribed.  Follow up with [facility] for primary care purposes and for recheck of your symptoms.  Return to the ED for any worsening symptoms       Tanda Rockers, PA-C 02/28/20 1430    Alvira Monday, MD 02/28/20 2349

## 2022-04-24 ENCOUNTER — Other Ambulatory Visit: Payer: Self-pay

## 2022-04-24 ENCOUNTER — Emergency Department (HOSPITAL_BASED_OUTPATIENT_CLINIC_OR_DEPARTMENT_OTHER)
Admission: EM | Admit: 2022-04-24 | Discharge: 2022-04-24 | Disposition: A | Discharge status: Home / Self Care | Payer: Medicaid Other | Attending: Emergency Medicine | Admitting: Emergency Medicine

## 2022-04-24 ENCOUNTER — Encounter (HOSPITAL_BASED_OUTPATIENT_CLINIC_OR_DEPARTMENT_OTHER): Payer: Self-pay | Admitting: Emergency Medicine

## 2022-04-24 DIAGNOSIS — N611 Abscess of the breast and nipple: Secondary | ICD-10-CM | POA: Diagnosis present

## 2022-04-24 DIAGNOSIS — I10 Essential (primary) hypertension: Secondary | ICD-10-CM | POA: Diagnosis not present

## 2022-04-24 DIAGNOSIS — S21001A Unspecified open wound of right breast, initial encounter: Secondary | ICD-10-CM

## 2022-04-24 MED ORDER — SULFAMETHOXAZOLE-TRIMETHOPRIM 800-160 MG PO TABS: 1.0000 | ORAL_TABLET | Freq: Two times a day (BID) | ORAL | 0 refills | Status: AC

## 2022-04-24 MED ORDER — SULFAMETHOXAZOLE-TRIMETHOPRIM 800-160 MG PO TABS
1.0000 | ORAL_TABLET | Freq: Once | ORAL | Status: AC
  Administered 2022-04-24: 1 via ORAL
  Filled 2022-04-24: qty 1

## 2022-04-24 NOTE — ED Triage Notes (Signed)
Pt POV steady gait  Reports abscess to right breast that drained yesterday. Denies drainage at time of triage.

## 2022-04-24 NOTE — Discharge Instructions (Addendum)
Please take antibiotics for 7 days for full course.  You have skin breakdown from your abscess drainage last night which may turn into an ulcerative wound.  Follow-up outpatient with your PCP or the [facility].  You could benefit from reevaluation in the breast center in 3 to 5 days.

## 2022-04-24 NOTE — ED Provider Notes (Signed)
[place] EMERGENCY DEPARTMENT AT [facility] Provider Note   CSN: [id number] Arrival date & time: 04/24/22  2052     History  Chief Complaint  Patient presents with   Abscess    Jaime Dunlap is a 42 y.o. female.   Abscess    42 year old female with medical history significant for recurrent abscesses, hypertension, thyroid disease who presents to the emergency department with a right breast lateral abscess.  The patient states that she developed an abscess, described as a pimple and subsequent painful area of fluctuance that she drained herself last night.  She squeezed on it hard until became bloody.  She then went to bed and woke up this morning and noticed a hole in the right side of her breast.  She has had persistent drainage from that area.  No fevers or chills, minimal surrounding redness.  Home Medications Prior to Admission medications   Medication Sig Start Date End Date Taking? Authorizing Provider  sulfamethoxazole-trimethoprim (BACTRIM DS) 800-160 MG tablet Take 1 tablet by mouth 2 (two) times daily for 7 days. 04/24/22 05/01/22 Yes Ernie Avena, MD  cetirizine (ZYRTEC ALLERGY) 10 MG tablet Take 1 tablet (10 mg total) by mouth daily. 02/28/20   Hyman Hopes, Margaux, PA-C  fluticasone (FLONASE) 50 MCG/ACT nasal spray Place 1 spray into both nostrils daily. 02/28/20 03/29/20  Hyman Hopes, Margaux, PA-C  guaiFENesin-dextromethorphan (ROBITUSSIN DM) 100-10 MG/5ML syrup Take 5 mLs by mouth every 4 (four) hours as needed for cough. 04/29/17   Molpus, John, MD  NIFEdipine (PROCARDIA XL/NIFEDICAL XL) 60 MG 24 hr tablet Take 1 tablet (60 mg total) by mouth daily for 30 days. 05/25/18 06/24/18  Cristina Gong, PA-C  vitamin B-6 (PYRIDOXINE) 25 MG tablet Take 1 tablet (25 mg total) by mouth every 8 (eight) hours as needed. 04/04/17   Tegeler, Canary Brim, MD      Allergies    Benadryl [diphenhydramine hcl]    Review of Systems   Review of Systems  All other systems reviewed  and are negative.   Physical Exam Updated Vital Signs BP (!) 159/88 (BP Location: Left Arm)   Pulse 93   Temp 99.9 F (37.7 C) (Oral)   Resp 16   Ht 4\' 10"  (1.473 m)   Wt 67.5 kg   LMP 04/08/2022 (Approximate)   SpO2 100%   BMI 31.12 kg/m  Physical Exam Vitals and nursing note reviewed. Exam conducted with a chaperone present.  Constitutional:      General: She is not in acute distress. HENT:     Head: Normocephalic and atraumatic.  Eyes:     Conjunctiva/sclera: Conjunctivae normal.     Pupils: Pupils are equal, round, and reactive to light.  Cardiovascular:     Rate and Rhythm: Normal rate and regular rhythm.  Pulmonary:     Effort: Pulmonary effort is normal. No respiratory distress.  Chest:       Comments: Sensitive breast exam performed with a chaperone present bedside, right breast lateral aspect significant for draining ruptured abscess with 2 small open wounds present, minimal surrounding erythema Abdominal:     General: There is no distension.     Tenderness: There is no guarding.  Musculoskeletal:        General: No deformity or signs of injury.     Cervical back: Neck supple.  Skin:    Findings: No lesion or rash.  Neurological:     General: No focal deficit present.     Mental Status: She is  alert. Mental status is at baseline.     ED Results / Procedures / Treatments   Labs (all labs ordered are listed, but only abnormal results are displayed) Labs Reviewed - No data to display  EKG None  Radiology No results found.  Procedures Procedures    Medications Ordered in ED Medications  sulfamethoxazole-trimethoprim (BACTRIM DS) 800-160 MG per tablet 1 tablet (has no administration in time range)    ED Course/ Medical Decision Making/ A&P                             Medical Decision Making   42 year old female with medical history significant for recurrent abscesses, hypertension, thyroid disease who presents to the emergency department  with a right breast lateral abscess.  The patient states that she developed an abscess, described as a pimple and subsequent painful area of fluctuance that she drained herself last night.  She squeezed on it hard until became bloody.  She then went to bed and woke up this morning and noticed a hole in the right side of her breast.  She has had persistent drainage from that area.  No fevers or chills, minimal surrounding redness.  On arrival, the patient was afebrile, vitally stable.  Physical exam concerning for a ruptured abscess with 2 small open wounds, draining minimal purulence.  Minimal surrounding cellulitis noted.  No appreciable area for continued drainage and the abscess appears to have been ruptured and is now actively draining.  Xeroform and gauze dressing was subsequently placed.  We will start the patient on Bactrim and have her follow-up with the [facility] center with plan for referral to the breast center as needed.   Final Clinical Impression(s) / ED Diagnoses Final diagnoses:  Breast abscess  Wound of right breast, initial encounter    Rx / DC Orders ED Discharge Orders          Ordered    sulfamethoxazole-trimethoprim (BACTRIM DS) 800-160 MG tablet  2 times daily        04/24/22 2124              Ernie Avena, MD 04/24/22 2125

## 2022-04-25 ENCOUNTER — Other Ambulatory Visit: Payer: Self-pay | Admitting: Emergency Medicine

## 2022-04-25 ENCOUNTER — Encounter: Payer: Self-pay | Admitting: Emergency Medicine

## 2022-04-25 DIAGNOSIS — N611 Abscess of the breast and nipple: Secondary | ICD-10-CM

## 2023-05-03 ENCOUNTER — Encounter (HOSPITAL_BASED_OUTPATIENT_CLINIC_OR_DEPARTMENT_OTHER): Payer: Self-pay | Admitting: Emergency Medicine

## 2023-05-03 ENCOUNTER — Other Ambulatory Visit: Payer: Self-pay

## 2023-05-03 ENCOUNTER — Emergency Department (HOSPITAL_BASED_OUTPATIENT_CLINIC_OR_DEPARTMENT_OTHER)
Admission: EM | Admit: 2023-05-03 | Discharge: 2023-05-03 | Disposition: A | Discharge status: Home / Self Care | Attending: Emergency Medicine | Admitting: Emergency Medicine

## 2023-05-03 DIAGNOSIS — Z79899 Other long term (current) drug therapy: Secondary | ICD-10-CM | POA: Diagnosis not present

## 2023-05-03 DIAGNOSIS — F17200 Nicotine dependence, unspecified, uncomplicated: Secondary | ICD-10-CM | POA: Insufficient documentation

## 2023-05-03 DIAGNOSIS — I10 Essential (primary) hypertension: Secondary | ICD-10-CM | POA: Diagnosis not present

## 2023-05-03 DIAGNOSIS — L02416 Cutaneous abscess of left lower limb: Secondary | ICD-10-CM | POA: Diagnosis present

## 2023-05-03 LAB — URINALYSIS, ROUTINE W REFLEX MICROSCOPIC
Bilirubin Urine: NEGATIVE
Glucose, UA: NEGATIVE mg/dL
Ketones, ur: NEGATIVE mg/dL
Nitrite: NEGATIVE
Protein, ur: 30 mg/dL — AB
Specific Gravity, Urine: >=1.03 (ref 1.005–1.030)
pH: 5.5 (ref 5.0–8.0)

## 2023-05-03 LAB — WET PREP, GENITAL
Clue Cells Wet Prep HPF POC: NONE SEEN
Sperm: NONE SEEN
Trich, Wet Prep: NONE SEEN
WBC, Wet Prep HPF POC: >=10 — AB (ref <10)
Yeast Wet Prep HPF POC: NONE SEEN

## 2023-05-03 LAB — URINALYSIS, MICROSCOPIC (REFLEX)

## 2023-05-03 LAB — PREGNANCY, URINE: Preg Test, Ur: NEGATIVE

## 2023-05-03 MED ORDER — DOXYCYCLINE HYCLATE 100 MG PO TABS
100.0000 mg | ORAL_TABLET | Freq: Once | ORAL | Status: AC
  Administered 2023-05-03: 100 mg via ORAL
  Filled 2023-05-03: qty 1

## 2023-05-03 MED ORDER — DOXYCYCLINE HYCLATE 100 MG PO CAPS: 100.0000 mg | ORAL_CAPSULE | Freq: Two times a day (BID) | ORAL | 0 refills | Status: DC

## 2023-05-03 NOTE — Discharge Instructions (Signed)
 Visit any yeast infection on wet prep.  Take the antibiotic doxycycline  as directed.  You will be contacted if the urine culture grows significant amount of bacteria.  Also you will be contacted if the [facility] and Chlamydia testing is positive.  Return for any new or worse symptoms.

## 2023-05-03 NOTE — ED Provider Notes (Addendum)
 [place] [facility]GENCY DE[facility]MENT AT [facility] Provider Note   CSN: [id number] Arrival date & time: 05/03/23  1904     History  Chief Complaint  Patient presents with   Vaginal Discharge    Jaime Dunlap is a 43 y.o. female.  Patient concerned about the vaginal discharge.  But mostly concerned because she has had a cyst on her left inner thigh that has been draining for the past few days and she is afraid that that may have created a infection.  It has been draining a fair amount of pus earlier but it has reduced a lot now.  Patient is not worried about an STD.  No nausea no vomiting.  Temp here 99.4 pulse was 111 respirations 20 blood pressure 151/99.  Past medical history significant for thyroid disease hypertension and migraines.  Patient is an everyday smoker.  Patient is had a tubal ligation.  Patient has a history of several skin type abscesses.       Home Medications Prior to Admission medications   Medication Sig Start Date End Date Taking? Authorizing Provider  doxycycline  (VIBRAMYCIN ) 100 MG capsule Take 1 capsule (100 mg total) by mouth 2 (two) times daily. 05/03/23  Yes Tyrea Froberg, MD  cetirizine  (ZYRTEC  ALLERGY) 10 MG tablet Take 1 tablet (10 mg total) by mouth daily. 02/28/20   Venter, Margaux, PA-C  fluticasone  (FLONASE ) 50 MCG/ACT nasal spray Place 1 spray into both nostrils daily. 02/28/20 03/29/20  Venter, Margaux, PA-C  guaiFENesin -dextromethorphan (ROBITUSSIN DM) 100-10 MG/5ML syrup Take 5 mLs by mouth every 4 (four) hours as needed for cough. 04/29/17   Molpus, John, MD  NIFEdipine  (PROCARDIA  XL/NIFEDICAL XL) 60 MG 24 hr tablet Take 1 tablet (60 mg total) by mouth daily for 30 days. 05/25/18 06/24/18  Dwain Giovanni, PA-C  vitamin B-6 (PYRIDOXINE) 25 MG tablet Take 1 tablet (25 mg total) by mouth every 8 (eight) hours as needed. 04/04/17   Tegeler, Marine Sia, MD      Allergies    Diphenhydramine, Diphenhydramine hcl, Benadryl [diphenhydramine  hcl], Bupropion, and Nifedipine     Review of Systems   Review of Systems  Constitutional:  Negative for chills and fever.  HENT:  Negative for ear pain and sore throat.   Eyes:  Negative for pain and visual disturbance.  Respiratory:  Negative for cough and shortness of breath.   Cardiovascular:  Negative for chest pain and palpitations.  Gastrointestinal:  Negative for abdominal pain and vomiting.  Genitourinary:  Positive for vaginal discharge. Negative for dysuria and hematuria.  Musculoskeletal:  Negative for arthralgias and back pain.  Skin:  Negative for color change and rash.  Neurological:  Negative for seizures and syncope.  All other systems reviewed and are negative.   Physical Exam Updated Vital Signs BP (!) 151/99 (BP Location: Left Arm)   Pulse (!) 111   Temp 99.4 F (37.4 C)   Resp 20   Ht 1.473 m (4\' 10" )   Wt 65.8 kg   LMP 04/25/2023 (Exact Date)   SpO2 100%   BMI 30.31 kg/m  Physical Exam Vitals and nursing note reviewed.  Constitutional:      General: She is not in acute distress.    Appearance: Normal appearance. She is well-developed.  HENT:     Head: Normocephalic and atraumatic.  Eyes:     Conjunctiva/sclera: Conjunctivae normal.     Pupils: Pupils are equal, round, and reactive to light.  Cardiovascular:     Rate and Rhythm:  Normal rate and regular rhythm.     Heart sounds: No murmur heard. Pulmonary:     Effort: Pulmonary effort is normal. No respiratory distress.     Breath sounds: Normal breath sounds.  Abdominal:     General: There is no distension.     Palpations: Abdomen is soft.     Tenderness: There is no abdominal tenderness. There is no guarding.  Genitourinary:    General: Normal vulva.     Comments: Left inner thigh with an area of induration measuring about 3 cm.  An open wound that still doing a little bit of purulent drainage.  There is no swelling to the left labia minora or majora area.  No obvious  discharge. Musculoskeletal:        General: No swelling.     Cervical back: Normal range of motion and neck supple.  Skin:    General: Skin is warm and dry.     Capillary Refill: Capillary refill takes less than 2 seconds.  Neurological:     General: No focal deficit present.     Mental Status: She is alert and oriented to person, place, and time.  Psychiatric:        Mood and Affect: Mood normal.     ED Results / Procedures / Treatments   Labs (all labs ordered are listed, but only abnormal results are displayed) Labs Reviewed  WET PREP, GENITAL - Abnormal; Notable for the following components:      Result Value   WBC, Wet Prep HPF POC >=10 (*)    All other components within normal limits  URINALYSIS, ROUTINE W REFLEX MICROSCOPIC - Abnormal; Notable for the following components:   APPearance HAZY (*)    Hgb urine dipstick LARGE (*)    Protein, ur 30 (*)    Leukocytes,Ua SMALL (*)    All other components within normal limits  URINALYSIS, MICROSCOPIC (REFLEX) - Abnormal; Notable for the following components:   Bacteria, UA FEW (*)    All other components within normal limits  URINE CULTURE  PREGNANCY, URINE  GC/CHLAMYDIA PROBE AMP ([place]) NOT AT [facility]    EKG None  Radiology No results found.  Procedures Procedures    Medications Ordered in ED Medications  doxycycline  (VIBRA -TABS) tablet 100 mg (has no administration in time range)    ED Course/ Medical Decision Making/ A&P                                 Medical Decision Making Amount and/or Complexity of Data Reviewed Labs: ordered.  Risk Prescription drug management.   Patient's wet prep negative for clue cells or trigger yeast.  There was a fair amount of white blood cells.  Urinalysis negative for urinary tract infection.,  There were 11-20 whites 11-20 reds a few bacteria so sent for culture.  Patient's chlamydia GC is pending.  But for the left inner thigh healing abscess we will go ahead and  treat her with doxycycline .  Patient be notified if the urine grows a significant amount of bacteria or if GC or chlamydia is positive.  In addition pregnancy test negative. Final Clinical Impression(s) / ED Diagnoses Final diagnoses:  Abscess of left thigh    Rx / DC Orders ED Discharge Orders          Ordered    doxycycline  (VIBRAMYCIN ) 100 MG capsule  2 times daily  05/03/23 2113              Nicklas Barns, MD 05/03/23 2113    Nicklas Barns, MD 05/03/23 2113

## 2023-05-03 NOTE — ED Triage Notes (Signed)
 Patient reports vaginal itching and white discharge since yesterday. Used Monistat last night. States she woke up and now having swelling.

## 2023-05-06 LAB — URINE CULTURE: Culture: 80000 — AB

## 2023-05-06 LAB — GC/CHLAMYDIA PROBE AMP (~~LOC~~) NOT AT ARMC
Chlamydia: NEGATIVE
Comment: NEGATIVE
Comment: NORMAL
Neisseria Gonorrhea: NEGATIVE

## 2023-05-07 ENCOUNTER — Telehealth (HOSPITAL_BASED_OUTPATIENT_CLINIC_OR_DEPARTMENT_OTHER): Payer: Self-pay

## 2023-05-07 NOTE — Telephone Encounter (Signed)
 Post ED Visit - Positive Culture Follow-up  Culture report reviewed by antimicrobial stewardship pharmacist: [facility] [x]  [place], [place].D. []  Skeet Duke, Pharm.D., BCPS AQ-ID []  Leslee Rase, Pharm.D., BCPS []  Garland Junk, Pharm.D., BCPS []  [place], [place].D., BCPS, AAHIVP []  Alcide Aly, Pharm.D., BCPS, AAHIVP []  Jerri Morale, PharmD, BCPS []  Graham Laws, PharmD, BCPS []  Cleda Curly, PharmD, BCPS []  Tamar Fairly, PharmD []  Ballard Levels, PharmD, BCPS []  Ollen Beverage, PharmD  [facility] []  Arlyne Bering, PharmD []  Sherryle Don, PharmD []  [place], PharmD []  Delila Felty, Rph []  [place]) Cleora Daft, PharmD []  Augustina Block, PharmD []  Arie Kurtz, PharmD []  Sharlyn Deaner, PharmD []  Agnes Hose, PharmD []  Kendall Pauls, PharmD []  Gladstone Lamer, PharmD []  Armanda Bern, PharmD []  Tera Fellows, PharmD   Positive urine culture Seen for abscess on leg. No urinary s/s noted. and no further patient follow-up is required at this time.  Delena Feil 05/07/2023, 7:52 AM

## 2023-10-15 ENCOUNTER — Emergency Department (HOSPITAL_BASED_OUTPATIENT_CLINIC_OR_DEPARTMENT_OTHER)
Admission: EM | Admit: 2023-10-15 | Discharge: 2023-10-15 | Disposition: A | Discharge status: Home / Self Care | Attending: Emergency Medicine | Admitting: Emergency Medicine

## 2023-10-15 ENCOUNTER — Other Ambulatory Visit: Payer: Self-pay

## 2023-10-15 ENCOUNTER — Encounter (HOSPITAL_BASED_OUTPATIENT_CLINIC_OR_DEPARTMENT_OTHER): Payer: Self-pay | Admitting: Emergency Medicine

## 2023-10-15 DIAGNOSIS — R300 Dysuria: Secondary | ICD-10-CM | POA: Diagnosis present

## 2023-10-15 DIAGNOSIS — N3000 Acute cystitis without hematuria: Secondary | ICD-10-CM | POA: Diagnosis not present

## 2023-10-15 LAB — URINALYSIS, ROUTINE W REFLEX MICROSCOPIC
Bilirubin Urine: NEGATIVE
Glucose, UA: NEGATIVE mg/dL
Ketones, ur: 15 mg/dL — AB
Nitrite: NEGATIVE
Protein, ur: 30 mg/dL — AB
Specific Gravity, Urine: >=1.03 (ref 1.005–1.030)
pH: 5.5 (ref 5.0–8.0)

## 2023-10-15 LAB — URINALYSIS, MICROSCOPIC (REFLEX)

## 2023-10-15 MED ORDER — FLUCONAZOLE 150 MG PO TABS: 150.0000 mg | ORAL_TABLET | Freq: Every day | ORAL | 0 refills | Status: AC

## 2023-10-15 MED ORDER — FLUCONAZOLE 150 MG PO TABS: 150.0000 mg | ORAL_TABLET | Freq: Every day | ORAL | 0 refills | Status: DC

## 2023-10-15 MED ORDER — NITROFURANTOIN MONOHYD MACRO 100 MG PO CAPS: 100.0000 mg | ORAL_CAPSULE | Freq: Two times a day (BID) | ORAL | 0 refills | Status: AC

## 2023-10-15 NOTE — Discharge Instructions (Signed)
 Please read and follow all provided instructions.  Your diagnoses today include:  1. Acute cystitis without hematuria     Tests performed today include: Urine test - suggests that you have an infection in your bladder Vital signs. See below for your results today.   Medications prescribed:  Macrobid  - antibiotic for urinary tract infection  You have been prescribed an antibiotic medicine: take the entire course of medicine even if you are feeling better. Stopping early can cause the antibiotic not to work.  Home care instructions:  Follow any educational materials contained in this packet.  Follow-up instructions: Please follow-up with your primary care provider in 3 days if symptoms are not resolved for further evaluation of your symptoms.  Return instructions:  Please return to the Emergency Department if you experience worsening symptoms.  Return with fever, worsening pain, persistent vomiting, worsening pain in your back.  Please return if you have any other emergent concerns.  Additional Information:  Your vital signs today were: BP (!) 156/99 (BP Location: Left Arm)   Pulse 97   Temp 98.2 F (36.8 C) (Oral)   Resp 16   Ht 4' 10 (1.473 m)   Wt 64.6 kg   SpO2 100%   BMI 29.77 kg/m  If your blood pressure (BP) was elevated above 135/85 this visit, please have this repeated by your doctor within one month. --------------

## 2023-10-15 NOTE — ED Triage Notes (Signed)
 Dysuria started today denies  vag d/c states urine is cloudy  lower abd pain

## 2023-10-15 NOTE — ED Provider Notes (Signed)
 [place] [facility] AT [facility] Provider Note   CSN: [id number] Arrival date & time: 10/15/23  1441     Patient presents with: Dysuria   Jaime Dunlap is a 43 y.o. female.   Patient presents to the emergency department today for evaluation of dysuria and increased frequency.  Patient reports history of UTI.  Symptoms started this morning.  No fevers, back pain, vomiting.  She states that her symptoms are consistent with previous UTIs.  She denies vaginal symptoms or concern for sexually transmitted infection.       Prior to Admission medications   Medication Sig Start Date End Date Taking? Authorizing Provider  nitrofurantoin , macrocrystal-monohydrate, (MACROBID ) 100 MG capsule Take 1 capsule (100 mg total) by mouth 2 (two) times daily. 10/15/23  Yes Desiderio Chew, PA-C  cetirizine  (ZYRTEC  ALLERGY) 10 MG tablet Take 1 tablet (10 mg total) by mouth daily. 02/28/20   Shepard Clinch, PA-C  fluconazole  (DIFLUCAN ) 150 MG tablet Take 1 tablet (150 mg total) by mouth daily. 10/15/23   Danialle Dement, PA-C  fluticasone  (FLONASE ) 50 MCG/ACT nasal spray Place 1 spray into both nostrils daily. 02/28/20 03/29/20  Shepard Clinch, PA-C  guaiFENesin -dextromethorphan (ROBITUSSIN DM) 100-10 MG/5ML syrup Take 5 mLs by mouth every 4 (four) hours as needed for cough. 04/29/17   Molpus, John, MD  NIFEdipine  (PROCARDIA  XL/NIFEDICAL XL) 60 MG 24 hr tablet Take 1 tablet (60 mg total) by mouth daily for 30 days. 05/25/18 06/24/18  Windle Almarie ORN, PA-C  vitamin B-6 (PYRIDOXINE) 25 MG tablet Take 1 tablet (25 mg total) by mouth every 8 (eight) hours as needed. 04/04/17   Tegeler, Lonni PARAS, MD    Allergies: Diphenhydramine, Diphenhydramine hcl, Benadryl [diphenhydramine hcl], Bupropion, and Nifedipine     Review of Systems  Updated Vital Signs BP (!) 156/99 (BP Location: Left Arm)   Pulse 97   Temp 98.2 F (36.8 C) (Oral)   Resp 16   Ht 4' 10 (1.473 m)   Wt 64.6 kg    SpO2 100%   BMI 29.77 kg/m   Physical Exam Vitals and nursing note reviewed.  Constitutional:      Appearance: She is well-developed.  HENT:     Head: Normocephalic and atraumatic.  Eyes:     Conjunctiva/sclera: Conjunctivae normal.  Pulmonary:     Effort: No respiratory distress.  Musculoskeletal:     Cervical back: Normal range of motion and neck supple.  Skin:    General: Skin is warm and dry.  Neurological:     Mental Status: She is alert.     (all labs ordered are listed, but only abnormal results are displayed) Labs Reviewed  URINALYSIS, ROUTINE W REFLEX MICROSCOPIC - Abnormal; Notable for the following components:      Result Value   APPearance CLOUDY (*)    Hgb urine dipstick TRACE (*)    Ketones, ur 15 (*)    Protein, ur 30 (*)    Leukocytes,Ua SMALL (*)    All other components within normal limits  URINALYSIS, MICROSCOPIC (REFLEX) - Abnormal; Notable for the following components:   Bacteria, UA MANY (*)    All other components within normal limits    EKG: None  Radiology: No results found.   Procedures   Medications Ordered in the ED - No data to display  ED Course  Patient seen and examined. History obtained directly from patient. Work-up including labs, imaging, EKG ordered in triage, if performed, were reviewed.    Labs/EKG: Independently reviewed  and interpreted.  This included: UA which is consistent with UTI  Imaging: None ordered  Medications/Fluids: None ordered  Most recent vital signs reviewed and are as follows: BP (!) 156/99 (BP Location: Left Arm)   Pulse 97   Temp 98.2 F (36.8 C) (Oral)   Resp 16   Ht 4' 10 (1.473 m)   Wt 64.6 kg   SpO2 100%   BMI 29.77 kg/m   Initial impression: Acute uncomplicated cystitis  Home treatment plan: Will treat with Macrobid  x 5 days, Diflucan  if yeast infection with antibiotic.  Discussed use of over-the-counter Pyridium  if needed for irritative symptoms.  Return instructions discussed  with patient: Fever, vomiting, back pain  Follow-up instructions discussed with patient: PCP in 3 to 5 days if not improving                                   Medical Decision Making Amount and/or Complexity of Data Reviewed Labs: ordered.  Risk Prescription drug management.   Patient with irritative UTI symptoms, urine consistent with UTI.  Patient not concerned about vaginal symptoms or possibility of STI.  No flank pain, fever, vomiting to suggest pyelonephritis.  Patient looks well, nontoxic.  Will treat for UTI, return instructions as above.     Final diagnoses:  Acute cystitis without hematuria    ED Discharge Orders          Ordered    nitrofurantoin , macrocrystal-monohydrate, (MACROBID ) 100 MG capsule  2 times daily        10/15/23 1624    fluconazole  (DIFLUCAN ) 150 MG tablet  Daily,   Status:  Discontinued        10/15/23 1624    fluconazole  (DIFLUCAN ) 150 MG tablet  Daily        10/15/23 1624               Desiderio Chew, PA-C 10/15/23 1627    Yolande Lamar BROCKS, MD 10/20/23 1313

## 2023-10-15 NOTE — ED Notes (Signed)
 Pt alert and oriented X 4 at the time of discharge. RR even and unlabored. No acute distress noted. Pt verbalized understanding of discharge instructions as discussed. Pt ambulatory to lobby at time of discharge.

## 2023-12-28 ENCOUNTER — Other Ambulatory Visit: Payer: Self-pay

## 2023-12-28 ENCOUNTER — Emergency Department (HOSPITAL_BASED_OUTPATIENT_CLINIC_OR_DEPARTMENT_OTHER)
Admission: EM | Admit: 2023-12-28 | Discharge: 2023-12-28 | Disposition: A | Discharge status: Home / Self Care | Attending: Emergency Medicine | Admitting: Emergency Medicine

## 2023-12-28 ENCOUNTER — Encounter (HOSPITAL_BASED_OUTPATIENT_CLINIC_OR_DEPARTMENT_OTHER): Payer: Self-pay

## 2023-12-28 DIAGNOSIS — R300 Dysuria: Secondary | ICD-10-CM | POA: Insufficient documentation

## 2023-12-28 DIAGNOSIS — N3000 Acute cystitis without hematuria: Secondary | ICD-10-CM

## 2023-12-28 LAB — URINALYSIS, ROUTINE W REFLEX MICROSCOPIC
Bilirubin Urine: NEGATIVE
Glucose, UA: NEGATIVE mg/dL
Hgb urine dipstick: NEGATIVE
Ketones, ur: NEGATIVE mg/dL
Nitrite: NEGATIVE
Protein, ur: 30 mg/dL — AB
Specific Gravity, Urine: 1.025 (ref 1.005–1.030)
pH: 6 (ref 5.0–8.0)

## 2023-12-28 LAB — URINALYSIS, MICROSCOPIC (REFLEX)

## 2023-12-28 MED ORDER — SULFAMETHOXAZOLE-TRIMETHOPRIM 800-160 MG PO TABS: 1.0000 | ORAL_TABLET | Freq: Two times a day (BID) | ORAL | 0 refills | Status: AC

## 2023-12-28 NOTE — ED Provider Notes (Signed)
 [place] [facility] Provider Note   CSN: [id number] Arrival date & time: 12/28/23  9070     Patient presents with: Dysuria   Jaime Dunlap is a 43 y.o. female who presents for evaluation of dysuria. Hx of recurrent UTI. Patient denies vaginal sxs. No flank pain, fevers, or vomiting.     Dysuria      Prior to Admission medications  Medication Sig Start Date End Date Taking? Authorizing Provider  cetirizine  (ZYRTEC  ALLERGY) 10 MG tablet Take 1 tablet (10 mg total) by mouth daily. 02/28/20   Shepard Clinch, PA-C  fluconazole  (DIFLUCAN ) 150 MG tablet Take 1 tablet (150 mg total) by mouth daily. 10/15/23   Geiple, Joshua, PA-C  fluticasone  (FLONASE ) 50 MCG/ACT nasal spray Place 1 spray into both nostrils daily. 02/28/20 03/29/20  Venter, Margaux, PA-C  guaiFENesin -dextromethorphan (ROBITUSSIN DM) 100-10 MG/5ML syrup Take 5 mLs by mouth every 4 (four) hours as needed for cough. 04/29/17   Molpus, John, MD  NIFEdipine  (PROCARDIA  XL/NIFEDICAL XL) 60 MG 24 hr tablet Take 1 tablet (60 mg total) by mouth daily for 30 days. 05/25/18 06/24/18  Windle Almarie ORN, PA-C  nitrofurantoin , macrocrystal-monohydrate, (MACROBID ) 100 MG capsule Take 1 capsule (100 mg total) by mouth 2 (two) times daily. 10/15/23   Desiderio Chew, PA-C  vitamin B-6 (PYRIDOXINE) 25 MG tablet Take 1 tablet (25 mg total) by mouth every 8 (eight) hours as needed. 04/04/17   Tegeler, Lonni PARAS, MD    Allergies: Diphenhydramine, Diphenhydramine hcl, Benadryl [diphenhydramine hcl], Bupropion, and Nifedipine     Review of Systems  Genitourinary:  Positive for dysuria.    Updated Vital Signs BP (!) 169/95 (BP Location: Right Arm)   Pulse 83   Temp 98.5 F (36.9 C) (Oral)   Resp 18   Ht 4' 10 (1.473 m)   Wt 65.8 kg   SpO2 98%   BMI 30.31 kg/m   Physical Exam Vitals and nursing note reviewed.  Constitutional:      General: She is not in acute distress.    Appearance: She is  well-developed. She is not diaphoretic.  HENT:     Head: Normocephalic and atraumatic.     Right Ear: External ear normal.     Left Ear: External ear normal.     Nose: Nose normal.     Mouth/Throat:     Mouth: Mucous membranes are moist.  Eyes:     General: No scleral icterus.    Conjunctiva/sclera: Conjunctivae normal.  Cardiovascular:     Rate and Rhythm: Normal rate and regular rhythm.     Heart sounds: Normal heart sounds. No murmur heard.    No friction rub. No gallop.  Pulmonary:     Effort: Pulmonary effort is normal. No respiratory distress.     Breath sounds: Normal breath sounds.  Abdominal:     General: Bowel sounds are normal. There is no distension.     Palpations: Abdomen is soft. There is no mass.     Tenderness: There is no abdominal tenderness. There is no right CVA tenderness, left CVA tenderness or guarding.  Musculoskeletal:     Cervical back: Normal range of motion.  Skin:    General: Skin is warm and dry.  Neurological:     Mental Status: She is alert and oriented to person, place, and time.  Psychiatric:        Behavior: Behavior normal.     (all labs ordered are listed, but only abnormal results are  displayed) Labs Reviewed  URINALYSIS, ROUTINE W REFLEX MICROSCOPIC - Abnormal; Notable for the following components:      Result Value   APPearance HAZY (*)    Protein, ur 30 (*)    Leukocytes,Ua TRACE (*)    All other components within normal limits  URINALYSIS, MICROSCOPIC (REFLEX) - Abnormal; Notable for the following components:   Bacteria, UA MANY (*)    All other components within normal limits    EKG: None  Radiology: No results found.   Procedures   Medications Ordered in the ED - No data to display                                  Medical Decision Making Amount and/or Complexity of Data Reviewed Labs: ordered.  Risk Prescription drug management.   Patient with with dysuria and an history of recurrent urinary tract  infection.  I reviewed the patient's previous micro reports.  She was prescribed Macrobid  at her last visit and she had only intermediate sensitivity with 80,000 colony-forming units of Enterobacter cloacae Appears to be susceptible to Bactrim .  She does not have any evidence of pyelo-.  Will treat with Bactrim  for her urinary tract infection today.  I reviewed and interpreted her labs her urine appears infected.     Final diagnoses:  None    ED Discharge Orders     None          Arloa Chroman, PA-C 12/28/23 1215    Towana Ozell BROCKS, MD 12/28/23 [phone]

## 2023-12-28 NOTE — Discharge Instructions (Addendum)
 Contact a health care provider if:  Your symptoms don't get better after 1-2 days of taking antibiotics.  Your symptoms go away and then come back.  You have a fever or chills.  You vomit or feel like you may vomit.    Get help right away if:  You have very bad pain in your back or lower belly.  You faint.

## 2023-12-28 NOTE — ED Triage Notes (Addendum)
 Arrives POV with complaints of worsening UTI for 2 months. Seen here for the same and symptoms persisted after a course of antibiotics.

## 2023-12-28 NOTE — ED Notes (Signed)
 ED Provider at bedside.

## 2023-12-30 LAB — URINE CULTURE: Culture: >=100000 — AB

## 2023-12-31 ENCOUNTER — Telehealth (HOSPITAL_BASED_OUTPATIENT_CLINIC_OR_DEPARTMENT_OTHER): Payer: Self-pay | Admitting: *Deleted

## 2023-12-31 NOTE — Telephone Encounter (Signed)
 Post ED Visit - Positive Culture Follow-up  Culture report reviewed by antimicrobial stewardship pharmacist: [facility] [x]  Rankin Sams, [place].D. []  Venetia Gully, Pharm.D., BCPS AQ-ID []  Garrel Crews, Pharm.D., BCPS []  Almarie Lunger, Pharm.D., BCPS []  Organ, [address].D., BCPS, AAHIVP []  Rosaline Bihari, Pharm.D., BCPS, AAHIVP []  Vernell Meier, PharmD, BCPS []  Latanya Hint, PharmD, BCPS []  Donald Medley, PharmD, BCPS []  Rocky Bold, PharmD []  Dorothyann Alert, PharmD, BCPS []  Morene Babe, PharmD  [facility] []  Rosaline Edison, PharmD []  Romona Bliss, PharmD []  Dolphus Roller, PharmD []  Veva Seip, Rph []  Vernell Daunt) Leonce, PharmD []  Eva Allis, PharmD []  Rosaline Millet, PharmD []  Iantha Batch, PharmD []  Arvin Gauss, PharmD []  Wanda Hasting, PharmD []  Ronal Rav, PharmD []  Rocky Slade, PharmD []  Bard Jeans, PharmD   Positive urine culture Treated with sulfamethoxazole -trimethoprim , organism sensitive to the same and no further patient follow-up is required at this time.  Jaime Dunlap 12/31/2023, 12:25 PM
